# Patient Record
Sex: Female | Born: 1965 | Race: White | Hispanic: No | Marital: Married | Smoking: Never smoker
Health system: Southern US, Community
[De-identification: ages and names within clinical notes are randomized; demographics above are authoritative.]

## PROBLEM LIST (undated history)

## (undated) DIAGNOSIS — N920 Excessive and frequent menstruation with regular cycle: Secondary | ICD-10-CM

## (undated) DIAGNOSIS — R079 Chest pain, unspecified: Secondary | ICD-10-CM

## (undated) DIAGNOSIS — J309 Allergic rhinitis, unspecified: Secondary | ICD-10-CM

## (undated) DIAGNOSIS — K649 Unspecified hemorrhoids: Secondary | ICD-10-CM

## (undated) DIAGNOSIS — K602 Anal fissure, unspecified: Secondary | ICD-10-CM

## (undated) DIAGNOSIS — D649 Anemia, unspecified: Secondary | ICD-10-CM

## (undated) DIAGNOSIS — M2550 Pain in unspecified joint: Secondary | ICD-10-CM

## (undated) DIAGNOSIS — K625 Hemorrhage of anus and rectum: Secondary | ICD-10-CM

## (undated) DIAGNOSIS — E079 Disorder of thyroid, unspecified: Secondary | ICD-10-CM

## (undated) DIAGNOSIS — R600 Localized edema: Secondary | ICD-10-CM

## (undated) DIAGNOSIS — I8390 Asymptomatic varicose veins of unspecified lower extremity: Secondary | ICD-10-CM

## (undated) DIAGNOSIS — J45909 Unspecified asthma, uncomplicated: Secondary | ICD-10-CM

## (undated) HISTORY — DX: Unspecified hemorrhoids: K64.9

## (undated) HISTORY — DX: Allergic rhinitis, unspecified: J30.9

## (undated) HISTORY — DX: Unspecified asthma, uncomplicated: J45.909

## (undated) HISTORY — DX: Localized edema: R60.0

## (undated) HISTORY — PX: TONSILLECTOMY: SHX5217

## (undated) HISTORY — DX: Asymptomatic varicose veins of unspecified lower extremity: I83.90

## (undated) HISTORY — DX: Anemia, unspecified: D64.9

## (undated) HISTORY — PX: TONSILLECTOMY: SUR1361

## (undated) HISTORY — PX: ABDOMINAL SURGERY: SHX537

## (undated) HISTORY — DX: Chest pain, unspecified: R07.9

## (undated) HISTORY — DX: Excessive and frequent menstruation with regular cycle: N92.0

## (undated) HISTORY — PX: KIDNEY SURGERY: SHX687

## (undated) HISTORY — DX: Pain in unspecified joint: M25.50

## (undated) HISTORY — DX: Hemorrhage of anus and rectum: K62.5

## (undated) HISTORY — DX: Anal fissure, unspecified: K60.2

---

## 1999-06-15 ENCOUNTER — Other Ambulatory Visit: Admission: RE | Admit: 1999-06-15 | Discharge: 1999-06-15 | Payer: Self-pay | Admitting: Obstetrics and Gynecology

## 1999-12-04 ENCOUNTER — Inpatient Hospital Stay (HOSPITAL_COMMUNITY): Admission: AD | Admit: 1999-12-04 | Discharge: 1999-12-04 | Payer: Self-pay | Admitting: Obstetrics and Gynecology

## 2000-01-03 ENCOUNTER — Inpatient Hospital Stay (HOSPITAL_COMMUNITY): Admission: AD | Admit: 2000-01-03 | Discharge: 2000-01-06 | Payer: Self-pay | Admitting: Obstetrics & Gynecology

## 2000-02-09 ENCOUNTER — Other Ambulatory Visit: Admission: RE | Admit: 2000-02-09 | Discharge: 2000-02-09 | Payer: Self-pay | Admitting: Obstetrics and Gynecology

## 2001-02-26 ENCOUNTER — Other Ambulatory Visit: Admission: RE | Admit: 2001-02-26 | Discharge: 2001-02-26 | Payer: Self-pay | Admitting: Obstetrics and Gynecology

## 2002-02-28 ENCOUNTER — Other Ambulatory Visit: Admission: RE | Admit: 2002-02-28 | Discharge: 2002-02-28 | Payer: Self-pay | Admitting: Obstetrics and Gynecology

## 2003-04-22 ENCOUNTER — Other Ambulatory Visit: Admission: RE | Admit: 2003-04-22 | Discharge: 2003-04-22 | Payer: Self-pay | Admitting: Obstetrics and Gynecology

## 2004-07-13 ENCOUNTER — Other Ambulatory Visit: Admission: RE | Admit: 2004-07-13 | Discharge: 2004-07-13 | Payer: Self-pay | Admitting: Obstetrics and Gynecology

## 2005-05-08 ENCOUNTER — Ambulatory Visit (HOSPITAL_COMMUNITY): Admission: RE | Admit: 2005-05-08 | Discharge: 2005-05-08 | Payer: Self-pay | Admitting: Allergy

## 2005-07-19 ENCOUNTER — Other Ambulatory Visit: Admission: RE | Admit: 2005-07-19 | Discharge: 2005-07-19 | Payer: Self-pay | Admitting: Obstetrics and Gynecology

## 2009-07-09 ENCOUNTER — Encounter: Admission: RE | Admit: 2009-07-09 | Discharge: 2009-07-09 | Payer: Self-pay | Admitting: Allergy

## 2011-03-03 NOTE — Discharge Summary (Signed)
[facility]  Patient:    Donna Armstrong, Donna Armstrong                       MRN: [id number] Adm. Date:  [id number] Disc. Date: [id number] Attending:  Earlie Server Dictator:   Gayla Medicus, R.N.                           Discharge Summary  ADMISSION DIAGNOSES:          1. Intrauterine pregnancy at term.                               2. In early labor.                               3. Surgically scarred uterus.                               4. For repeat cesarean section.  DISCHARGE DIAGNOSES:          1. Intrauterine pregnancy at term.                               2. In early labor.                               3. Surgically scarred uterus.                               4. For repeat cesarean section.  PROCEDURE:                    On January 03, 2000, repeat cesarean section.  REASON FOR ADMISSION:         The patient is a 45 year old who is scheduled for  delivery in approximately two days.  She presented in labor and was brought to he operating room for a cesarean section.  HOSPITAL COURSE:              The patient is taken to the operating room and undergoes the above named procedure without complications.  This is productive f a viable female infant with Apgars of 8 at one minute and 9 at five minutes and an arterial cord pH of 7.32.  Postoperatively, the patient does well.  On postoperative day #1, the patients hemoglobin was 10.2, hematocrit 29.9, and white blood cell count 17.4.  She was tolerating a regular diet on this day and had good control of pain.  She also had a good return of bowel function and was ambulating well without difficulty.  The patient was discharged home on postoperative day 3 in good condition without complaint.  CONDITION ON DISCHARGE:       Good.  DIET:                         Regular as tolerated.  ACTIVITY:                     No heavy lifting, no driving, and no vaginal entry.  FOLLOW-UP:  She is to follow up in the office in one to two weeks for incision check and she is to call for a temperature greater than 100 degrees, persistent nausea and vomiting, heavy vaginal bleeding, and/or redness or drainage from the incision site.  DISCHARGE MEDICATIONS:        1. Prenatal vitamins one p.o. q.d.                               2. Ibuprofen 600 mg p.o. q.6h. p.r.n. pain. DD:  01/06/00 TD:  01/06/00 Job: [id number]

## 2011-03-03 NOTE — Op Note (Signed)
[facility]  Patient:    Donna Armstrong, Donna Armstrong                       MRN: [id number] Proc. Date: 01/03/00 Adm. Date:  [id number] Attending:  Earlie Server                           Operative Report  PREOPERATIVE DIAGNOSES:       Intrauterine pregnancy at term, surgically scarred uterus, with first delivery by cesarean section for cephalopelvic disproportion.  POSTOPERATIVE DIAGNOSES:      Intrauterine pregnancy at term, surgically scarred uterus, with first delivery by cesarean section for cephalopelvic disproportion.  OPERATIVE PROCEDURE:          Repeat cesarean section with delivery of viable female infant, Apgars of 8/9, birth weight 7 pounds 3 ounces, cord arterial pH 7.32.  SURGEON:                      Maisie Fus, M.D.  ANESTHESIA:                   Spinal.  INTRAOPERATIVE COMPLICATIONS:  None.  INDICATIONS:                  The patient is a 45 year old who was scheduled for delivery in approximately two days.  She presented in labor and was brought to he operating room for cesarean.  DESCRIPTION OF PROCEDURE:     After arrival in the operating room, she was placed under adequate spinal anesthesia and placed in a dorsal recumbent position. The lower abdomen was prepped with Hibiclens because of a Betadine allergy.  Foley catheter was placed under sterile technique.  Sterile drapes were applied.  A lower abdominal incision was made through an old scar and carried sharply down to fascia, which was entered sharply, and extended to the extent of the skin incision. Rectus sheath was developed superiorly and inferiorly with blunt and sharp dissection,  rectus muscles divided in the midline and peritoneum entered.  This was extended sharply and bluntly to the extent of skin incision.  Bladder blade was placed. A transverse incision was made in the visceroperitoneum overlying the lower uterine segment, then the bladder  dissected off the lower segment.  A transverse incision was made in the lower uterine segment and extended bluntly in a transverse direction.  Amniotomy revealed clear fluid.  Using a vacuum-assist device, the infant was then delivered without difficulty.  Apgars and cord pH are noted above. Placenta and other parts of conception were removed from the uterus.  Patient was given a gram of Ancef with clamping of the cord.  Uterine incision was closed in a single layer with running-locking 0 Monocryl.  A figure-of-eight was required at the left margin for complete hemostasis.  Irrigation was carried out.  With adequate hemostasis and appropriate pack, needle and instrument counts, the abdominal incision was closed in layers.  Please note the tubes and ovaries were inspected prior to closure and found to be normal.  The uterus itself was palpably normal.  The abdominal incision was closed with 0 Monocryl for peritoneum and to reapproximate rectus muscles, the fascia was closed with running 0 PDS and skin was closed with wide skin staples and quarter-inch Steri-Strips.  Estimated intraoperative blood loss was 600 cc.  The patient tolerated the procedure well and was taken to recovery in good  condition. DD:  01/04/00 TD:  01/04/00 Job: [id number]

## 2014-07-21 ENCOUNTER — Encounter: Payer: Self-pay | Admitting: Vascular Surgery

## 2014-07-23 ENCOUNTER — Encounter: Payer: Self-pay | Admitting: Vascular Surgery

## 2014-07-23 ENCOUNTER — Other Ambulatory Visit: Payer: Self-pay | Admitting: *Deleted

## 2014-07-23 DIAGNOSIS — I83893 Varicose veins of bilateral lower extremities with other complications: Secondary | ICD-10-CM

## 2014-07-30 ENCOUNTER — Encounter: Payer: Self-pay | Admitting: Vascular Surgery

## 2014-07-31 ENCOUNTER — Ambulatory Visit (INDEPENDENT_AMBULATORY_CARE_PROVIDER_SITE_OTHER): Payer: 59 | Admitting: Vascular Surgery

## 2014-07-31 ENCOUNTER — Ambulatory Visit (HOSPITAL_COMMUNITY)
Admission: RE | Admit: 2014-07-31 | Discharge: 2014-07-31 | Disposition: A | Discharge status: Home / Self Care | Payer: 59 | Source: Ambulatory Visit | Attending: Vascular Surgery | Admitting: Vascular Surgery

## 2014-07-31 ENCOUNTER — Encounter: Payer: Self-pay | Admitting: Vascular Surgery

## 2014-07-31 VITALS — BP 108/77 | HR 79 | Wt 165.9 lb

## 2014-07-31 DIAGNOSIS — I872 Venous insufficiency (chronic) (peripheral): Secondary | ICD-10-CM | POA: Insufficient documentation

## 2014-07-31 DIAGNOSIS — I83893 Varicose veins of bilateral lower extremities with other complications: Secondary | ICD-10-CM | POA: Insufficient documentation

## 2014-07-31 DIAGNOSIS — M7989 Other specified soft tissue disorders: Secondary | ICD-10-CM | POA: Insufficient documentation

## 2014-07-31 NOTE — Progress Notes (Signed)
Reason for referral: Swollen bilateral leg  History of Present Illness  Donna Armstrong is a 48 y.o. ([birth year]) female who presents with chief complaint: swollen leg.  Patient notes, onset of swelling 6 months ago, associated with no trigger.  The patient's symptoms include: swelling as day progresses with sense of tightness in calf and discomfort with such.  This was especially worsened on a trip to [place] recently.  The patient has had no history of DVT, no history of pregnancy, no history of varicose vein, no history of venous stasis ulcers, no history of  Lymphedema and no history of skin changes in lower legs.  There is known family history of venous disorders.  The patient has used OTC compression stockings in the past.  Past Medical History  Diagnosis Date  . Varicose veins   . Asthma   . Hemorrhoids   . Joint pain   . Menorrhagia   . Anemia     Past Surgical History  Procedure Laterality Date  . Tonsillectomy    . Cesarean section      X2  . Kidney surgery      kidney/bladder - exploratory     History   Social History  . Marital Status: Single    Spouse Name: N/A    Number of Children: N/A  . Years of Education: N/A   Occupational History  . Not on file.   Social History Main Topics  . Smoking status: Never Smoker   . Smokeless tobacco: Never Used  . Alcohol Use: No  . Drug Use: No  . Sexual Activity: Not on file   Other Topics Concern  . Not on file   Social History Narrative  . No narrative on file    Family History  Problem Relation Age of Onset  . Heart disease Mother   . Hyperlipidemia Mother   . Hypertension Mother   . Varicose Veins Mother   . Heart disease Father   . Hyperlipidemia Father   . Hypertension Father   . Heart attack Father      Current Outpatient Prescriptions on File Prior to Visit  Medication Sig Dispense Refill  . aspirin 81 MG tablet Take 81 mg by mouth daily.      . Calcium Carb-Cholecalciferol (CALCIUM 1000 + D  PO) Take 1,000 mg by mouth daily.      . cetirizine (ZYRTEC) 10 MG tablet Take 10 mg by mouth daily.      . fluticasone (FLONASE) 50 MCG/ACT nasal spray Place into both nostrils daily.      Marland Kitchen levalbuterol (XOPENEX HFA) 45 MCG/ACT inhaler Inhale 2 puffs into the lungs every 4 (four) hours as needed for wheezing.      . levalbuterol (XOPENEX) 1.25 MG/0.5ML nebulizer solution Take 1.25 mg by nebulization every 4 (four) hours as needed for wheezing or shortness of breath.      . Multiple Vitamin (MULTIVITAMIN) tablet Take 1 tablet by mouth daily.      Marland Kitchen PROGESTERONE, VAGINAL, 8 % GEL Place vaginally. Uses on chest and upper arms days 17-28 only       No current facility-administered medications on file prior to visit.    Allergies  Allergen Reactions  . Eggs Or Egg-Derived Products   . Ketek [Telithromycin] Other (See Comments)    Dizziness   . Erythromycin Rash    REVIEW OF SYSTEMS:  (Positives checked otherwise negative)  CARDIOVASCULAR:  []  chest pain, []  chest pressure, []  palpitations, []  shortness  of breath when laying flat, []  shortness of breath with exertion,  []  pain in feet when walking, []  pain in feet when laying flat, []  history of blood clot in veins (DVT), []  history of phlebitis, [x]  swelling in legs, []  varicose veins  PULMONARY:  []  productive cough, []  asthma, []  wheezing  NEUROLOGIC:  []  weakness in arms or legs, []  numbness in arms or legs, []  difficulty speaking or slurred speech, []  temporary loss of vision in one eye, []  dizziness  HEMATOLOGIC:  []  bleeding problems, []  problems with blood clotting too easily  MUSCULOSKEL:  []  joint pain, []  joint swelling  GASTROINTEST:  []  vomiting blood, []  blood in stool     GENITOURINARY:  []  burning with urination, []  blood in urine  PSYCHIATRIC:  []  history of major depression  INTEGUMENTARY:  []  rashes, []  ulcers  CONSTITUTIONAL:  []  fever, []  chills   Physical Examination Filed Vitals:   07/31/14 1119  BP:  108/77  Pulse: 79  Weight: 165 lb 14.4 oz (75.252 kg)  SpO2: 99%   There is no height on file to calculate BMI.  General: A&O x 3, WDWN  Head: [place]/AT  Ear/Nose/Throat: Hearing grossly intact, nares w/o erythema or drainage, oropharynx w/o Erythema/Exudate  Eyes: PERRLA, EOMI  Neck: Supple, no nuchal rigidity, no palpable LAD  Pulmonary: Sym exp, good air movt, CTAB, no rales, rhonchi, & wheezing  Cardiac: RRR, Nl S1, S2, no Murmurs, rubs or gallops  Vascular: Vessel Right Left  Radial Palpable Palpable  Brachial Palpable Palpable  Carotid Palpable, without bruit Palpable, without bruit  Aorta Not palpable N/A  Femoral Palpable Palpable  Popliteal Not palpable Not palpable  PT Palpable Palpable  DP Palpable Palpable   Gastrointestinal: soft, NTND, -G/R, - HSM, - masses, - CVAT B,   Musculoskeletal: M/S 5/5 throughout , Extremities without ischemic changes , 1+ edema B, no LDS  Neurologic: CN 2-12 intact , Pain and light touch intact in extremities , Motor exam as listed above  Psychiatric: Judgment intact, Mood & affect appropriate for pt's clinical situation  Dermatologic: See M/S exam for extremity exam, no rashes otherwise noted  Lymph : No Cervical, Axillary, or Inguinal lymphadenopathy   Non-Invasive Vascular Imaging  BLE Venous Insufficiency Duplex (Date: 07/31/2014):   RLE: no DVT and SVT, + GSV reflux, + deep venous reflux: CFV  LLE: no DVT and SVT, + GSV reflux, + deep venous reflux: PV  Outside Studies/Documentation 5 pages of outside documents were reviewed including: outpatient PCP chart.  Medical Decision Making  Donna Armstrong is a 48 y.o. female who presents with: BLE chronic venous insufficiency (C2).   Based on the patient's history and examination, I recommend: compressive therapy.  I suspect compression might be enough for this patient, given she does not have continuous segment of incompetency.  I discussed with the patient the use of  her 20-30 mm thigh high compression stockings and need for 3 month trial of such.  The patient will follow up in 3 months with my partners in the [facility] for evaluation for: EVLA B GSV.  Thank you for allowing [place] to participate in this patient's care.  Adele Barthel, MD [facility] Office: [phone] Pager: [phone]  07/31/2014, 11:47 AM

## 2014-11-02 ENCOUNTER — Encounter: Payer: Self-pay | Admitting: *Deleted

## 2014-11-03 ENCOUNTER — Ambulatory Visit: Payer: 59 | Admitting: Vascular Surgery

## 2014-11-10 ENCOUNTER — Ambulatory Visit: Payer: 59 | Admitting: Vascular Surgery

## 2015-07-01 ENCOUNTER — Other Ambulatory Visit: Payer: Self-pay | Admitting: Nurse Practitioner

## 2015-07-01 DIAGNOSIS — N6452 Nipple discharge: Secondary | ICD-10-CM

## 2015-07-09 ENCOUNTER — Ambulatory Visit
Admission: RE | Admit: 2015-07-09 | Discharge: 2015-07-09 | Disposition: A | Discharge status: Home / Self Care | Payer: Commercial Managed Care - HMO | Source: Ambulatory Visit | Attending: Nurse Practitioner | Admitting: Nurse Practitioner

## 2015-07-09 DIAGNOSIS — N6452 Nipple discharge: Secondary | ICD-10-CM

## 2017-02-14 ENCOUNTER — Telehealth: Payer: Self-pay

## 2017-02-14 NOTE — Telephone Encounter (Signed)
SENT NOTES TO SCHEDULING 

## 2017-02-15 ENCOUNTER — Encounter: Payer: Self-pay | Admitting: Cardiovascular Disease

## 2017-02-15 ENCOUNTER — Telehealth: Payer: Self-pay | Admitting: Cardiovascular Disease

## 2017-02-15 NOTE — Telephone Encounter (Signed)
Received records from [facility] for appointment on 02/16/17 with Dr Claiborne Billings.  Records put with Dr Evette Georges schedule for 02/16/17. lp

## 2017-02-16 ENCOUNTER — Encounter: Payer: Self-pay | Admitting: Cardiovascular Disease

## 2017-02-16 ENCOUNTER — Ambulatory Visit (INDEPENDENT_AMBULATORY_CARE_PROVIDER_SITE_OTHER): Payer: 59 | Admitting: Cardiovascular Disease

## 2017-02-16 VITALS — BP 114/76 | HR 72 | Ht 64.0 in | Wt 177.0 lb

## 2017-02-16 DIAGNOSIS — J45990 Exercise induced bronchospasm: Secondary | ICD-10-CM

## 2017-02-16 DIAGNOSIS — R079 Chest pain, unspecified: Secondary | ICD-10-CM

## 2017-02-16 DIAGNOSIS — R946 Abnormal results of thyroid function studies: Secondary | ICD-10-CM

## 2017-02-16 DIAGNOSIS — Z8249 Family history of ischemic heart disease and other diseases of the circulatory system: Secondary | ICD-10-CM

## 2017-02-16 DIAGNOSIS — J302 Other seasonal allergic rhinitis: Secondary | ICD-10-CM

## 2017-02-16 DIAGNOSIS — E039 Hypothyroidism, unspecified: Secondary | ICD-10-CM

## 2017-02-16 LAB — CBC
HCT: 41.4 % (ref 35.0–45.0)
Hemoglobin: 13 g/dL (ref 11.7–15.5)
MCH: 27.3 pg (ref 27.0–33.0)
MCHC: 31.4 g/dL — ABNORMAL LOW (ref 32.0–36.0)
MCV: 87 fL (ref 80.0–100.0)
MPV: 9.6 fL (ref 7.5–12.5)
PLATELETS: 402 10*3/uL — AB (ref 140–400)
RBC: 4.76 MIL/uL (ref 3.80–5.10)
RDW: 14.8 % (ref 11.0–15.0)
WBC: 8.2 10*3/uL (ref 3.8–10.8)

## 2017-02-16 MED ORDER — METOPROLOL TARTRATE 25 MG PO TABS: 12.5000 mg | ORAL_TABLET | Freq: Two times a day (BID) | ORAL | 6 refills | Status: DC

## 2017-02-16 NOTE — Progress Notes (Addendum)
Cardiology Office Note    Date:  02/16/2017   ID:  Donna Armstrong, Donna Armstrong 1966/04/20, MRN [id number]  PCP:  Lujean Amel, MD  Cardiologist:  Shelva Majestic, MD    Chief Complaint  Patient presents with  . Follow-up    New patient.  . Shortness of Breath  . Chest Pain    Tightness  . Edema    Cardiology consultation for exertional chest pain, referred by Dr. Dorthy Cooler  History of Present Illness:  Donna Armstrong is a 51 y.o. female who is referred through the courtesy of Dr. Janalyn Shy for evaluation of exertional chest tightness in this patient with a strong family history for CAD.  Donna Armstrong denies any history of known CAD.  Last year, she was hiking in the mountains and had experienced chest pressure all walking up steep terrain.  She admits that she has not been exercising regularly.  She has gained weight recently and as a result has been trying over the last several weeks to increase her walking and hasn't walking 2-3 times per week.  She has recently noticed a sensation of exertionally precipitated chest tightness which is different from her previous exertional asthma type chest discomfort.  These episodes are short-lived, but always been exertionally precipitated and resolve with rest.  She is unaware of any left arm radiation.  There is mild shortness of breath.  She denies associated palpitations, nausea or diaphoresis.  She was recently evaluated by her primary physician, Dr. Dorthy Cooler and with her family history for heart disease with her father having undergone bypass surgery and MI and had undergone several additional interventions in a grandfather who died around 38 with an MI.  With her change in symptomatology.  She is now referred for cardiology consultation.  Her history is also notable for mild exertional asthma.  She states a while back.  She was on a very low dose of thyroid medicine when she was told that she had possible mild hypothyroidism.  She has not had her  cholesterol checked in several years.  There is no history of hypertension.  She denies palpitations.  She admits to some daytime sleepiness and an Epworth Sleepiness Scale score was calculated in the office today and endorsed at 11. She sleeps well at night and is unaware of any snoring or frequent awakenings.   Past Medical History:  Diagnosis Date  . Allergic rhinitis    DR. [place] [facility] [place]  . Anal fissure   . Anemia   . Asthma   . Bilateral leg edema   . Hemorrhoids   . Joint pain   . Menorrhagia   . Menorrhagia   . Rectal bleeding   . Varicose veins     Past Surgical History:  Procedure Laterality Date  . CESAREAN SECTION     X2  . KIDNEY SURGERY     kidney/bladder - exploratory   . TONSILLECTOMY      Current Medications: Outpatient Medications Prior to Visit  Medication Sig Dispense Refill  . aspirin 81 MG tablet Take 81 mg by mouth daily.    . Calcium Carb-Cholecalciferol (CALCIUM 1000 + D PO) Take 1,000 mg by mouth daily.    . cetirizine (ZYRTEC) 10 MG tablet Take 10 mg by mouth daily.    . fluticasone (FLONASE) 50 MCG/ACT nasal spray Place into both nostrils daily.    Marland Kitchen levalbuterol (XOPENEX HFA) 45 MCG/ACT inhaler Inhale 2 puffs into the lungs every 4 (four) hours as needed for wheezing.    Marland Kitchen  levalbuterol (XOPENEX) 1.25 MG/0.5ML nebulizer solution Take 1.25 mg by nebulization every 4 (four) hours as needed for wheezing or shortness of breath.    . Multiple Vitamin (MULTIVITAMIN) tablet Take 1 tablet by mouth daily.    Marland Kitchen PROGESTERONE, VAGINAL, 8 % GEL Place vaginally. Uses on chest and upper arms days 17-28 only    . valACYclovir (VALTREX) 1000 MG tablet Take 1,000 mg by mouth 3 (three) times daily.     No facility-administered medications prior to visit.      Allergies:   Ketek [telithromycin] and Erythromycin   Social History   Social History  . Marital status: Single    Spouse name: N/A  . Number of children: N/A  . Years of education: N/A   Occupational  History  . COMPUTER PROGRAMMER    Social History Main Topics  . Smoking status: Never Smoker  . Smokeless tobacco: Never Used  . Alcohol use No  . Drug use: No  . Sexual activity: Not Asked   Other Topics Concern  . None   Social History Narrative  . None    Socially she is married for 29 years.  She has 2 children.  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  She has a Scientist, water quality and had attended [employer] as well as [employer].  Family History:  The patient's family history includes Heart attack in her father; Heart attack (age of onset: 21) in her paternal grandfather; Heart disease in her father and mother; Hyperlipidemia in her father and mother; Hypertension in her father and mother; Varicose Veins in her mother.  Her father had undergone prior CABG revascularization surgery  ROS General: Negative; No fevers, chills, or night sweats;  HEENT: Negative; No changes in vision or hearing, sinus congestion, difficulty swallowing Pulmonary: Positive for mild exertional asthma; she takes allergy shots with Dr. Donneta Romberg Cardiovascular: Negative; No chest pain, presyncope, syncope, palpitations GI: Negative; No nausea, vomiting, diarrhea, or abdominal pain GU: Negative; No dysuria, hematuria, or difficulty voiding Musculoskeletal: Negative; no myalgias, joint pain, or weakness Hematologic/Oncology: Negative; no easy bruising, bleeding Endocrine:  She had taken a very low-dose of levothyroxine a short time for possible borderline hypothyroidism Neuro: Negative; no changes in balance, headaches Skin: Negative; No rashes or skin lesions Psychiatric: Negative; No behavioral problems, depression Sleep:  mild daytime sleepiness, no snoring, bruxism, restless legs, hypnogognic hallucinations, no cataplexy Other comprehensive 14 point system review is negative.   PHYSICAL EXAM:   VS:  BP 114/76   Pulse 72   Ht 5' 4"  (1.626 m)   Wt 177 lb (80.3 kg)   BMI 30.38 kg/m     Repeat blood pressure  by me 112/78  Wt Readings from Last 3 Encounters:  02/16/17 177 lb (80.3 kg)  07/31/14 165 lb 14.4 oz (75.3 kg)    General: Alert, oriented, no distress.  Skin: normal turgor, no rashes, warm and dry HEENT: Normocephalic, atraumatic. Pupils equal round and reactive to light; sclera anicteric; extraocular muscles intact; Fundi Normal Nose without nasal septal hypertrophy Mouth/Parynx benign; Mallinpatti scale 3 Neck: No JVD, no carotid bruits; normal carotid upstroke Lungs: clear to ausculatation and percussion; no wheezing or rales Chest wall: without tenderness to palpitation Heart: PMI not displaced, RRR, s1 s2 normal, trivial 1/6 systolic murmur, no diastolic murmur, no rubs, gallops, thrills, or heaves Abdomen: soft, nontender; no hepatosplenomehaly, BS+; abdominal aorta nontender and not dilated by palpation. Back: no CVA tenderness Pulses 2+ Musculoskeletal: full range of motion, normal strength, no  joint deformities Extremities: Trace bilateral ankle edema ;no clubbing cyanosis, Homan's sign negative  Neurologic: grossly nonfocal; Cranial nerves grossly wnl Psychologic: Normal mood and affect   Studies/Labs Reviewed:   EKG:  EKG is ordered today.  ECG (independently read by me): Normal sinus rhythm at 72 bpm.  Nondiagnostic T-wave in lead 3.  Normal intervals.  Recent Labs: No flowsheet data found.   No flowsheet data found.  No flowsheet data found. No results found for: MCV No results found for: TSH No results found for: HGBA1C   BNP No results found for: BNP  ProBNP No results found for: PROBNP   Lipid Panel  No results found for: CHOL, TRIG, HDL, CHOLHDL, VLDL, LDLCALC, LDLDIRECT   RADIOLOGY: No results found.   Additional studies/ records that were reviewed today include:  I have reviewed her records from [place]:    1. Exertional chest pain   2. Family history of early CAD   3. Borderline hypothyroidism   4. Exercise-induced  asthma   5. Seasonal allergic rhinitis, unspecified trigger      PLAN:  Donna Armstrong is a 51 year old female who is a strong family history for CAD with the mother having history of hypertension and is status post pacemaker insertion, her father who had myocardial infarctions 2, and prior bypass surgery as well as a grandfather who suffered a heart attack prematurely.  The patient admits to development of exertionally precipitated chest tightness.  She had experienced an initial episode one year ago, but had not been very active until recently when she has increased her walking and has noticed recurrent symptomatology.  She experiences the symptoms with walking up hill in her driveway and also while climbing steps.  Typically these episodes last only several minutes, but abate when she stops walking.  There is no history of hypertension, tobacco use, or awareness of hyperlipidemia.  She is fasting today.  A complete set of fasting laboratory will be obtained.  I spent time with her discussing her symptomatology.  Her symptoms are clearly different from her sensation of exertional asthma that she has experienced in the past.  We discussed different strategies for an ischemic workup including exercise nuclear imaging versus definitive cardiac catheterization.  Presently, I'm adding metoprolol 12.5 mg twice a day to her medical regimen.  After much discussion, she will undergo an exercise nuclear stress test next week in our office.  I will see her the following week for follow-up evaluation and further recommendations will be made at that time.  She will hold her metoprolol for 48 hours prior to the exercise nuclear study.  I discussed the sensitivity and specificity of exercise nuclear imaging as well as the risks, benefits of cardiac catheterization in detail.  I will see her in 2 weeks for reevaluation.   Medication Adjustments/Labs and Tests Ordered: Current medicines are reviewed at length with the  patient today.  Concerns regarding medicines are outlined above.  Medication changes, Labs and Tests ordered today are listed in the Patient Instructions below. Patient Instructions  Your physician has requested that you have en exercise stress myoview next week.  Your physician recommends that you return for lab work TODAY.   Your physician recommends that you schedule a follow-up appointment on May 15th      Signed, Shelva Majestic, MD  02/16/2017 9:42 AM    [facility] [address] [place], [place],   [address] Phone: [phone]

## 2017-02-16 NOTE — Patient Instructions (Signed)
Your physician has requested that you have en exercise stress myoview next week.  Your physician recommends that you return for lab work TODAY.   Your physician recommends that you schedule a follow-up appointment on May 15th

## 2017-02-17 LAB — COMPREHENSIVE METABOLIC PANEL
ALK PHOS: 166 U/L — AB (ref 33–130)
ALT: 22 U/L (ref 6–29)
AST: 19 U/L (ref 10–35)
Albumin: 3.8 g/dL (ref 3.6–5.1)
BUN: 13 mg/dL (ref 7–25)
CO2: 27 mmol/L (ref 20–31)
Calcium: 9.2 mg/dL (ref 8.6–10.4)
Chloride: 104 mmol/L (ref 98–110)
Creat: 0.76 mg/dL (ref 0.50–1.05)
GLUCOSE: 86 mg/dL (ref 65–99)
POTASSIUM: 4.8 mmol/L (ref 3.5–5.3)
Sodium: 139 mmol/L (ref 135–146)
Total Bilirubin: 0.5 mg/dL (ref 0.2–1.2)
Total Protein: 6.9 g/dL (ref 6.1–8.1)

## 2017-02-17 LAB — LIPID PANEL
CHOL/HDL RATIO: 2.2 ratio (ref <5.0)
Cholesterol: 191 mg/dL (ref <200)
HDL: 86 mg/dL (ref >50)
LDL CALC: 93 mg/dL (ref <100)
Triglycerides: 62 mg/dL (ref <150)
VLDL: 12 mg/dL (ref <30)

## 2017-02-17 LAB — TSH: TSH: 3.51 m[IU]/L

## 2017-02-21 ENCOUNTER — Telehealth (HOSPITAL_COMMUNITY): Payer: Self-pay

## 2017-02-21 NOTE — Telephone Encounter (Signed)
Encounter complete. 

## 2017-02-23 ENCOUNTER — Ambulatory Visit (HOSPITAL_COMMUNITY)
Admission: RE | Admit: 2017-02-23 | Discharge: 2017-02-23 | Disposition: A | Discharge status: Home / Self Care | Payer: 59 | Source: Ambulatory Visit | Attending: Cardiovascular Disease | Admitting: Cardiovascular Disease

## 2017-02-23 DIAGNOSIS — J45909 Unspecified asthma, uncomplicated: Secondary | ICD-10-CM | POA: Insufficient documentation

## 2017-02-23 DIAGNOSIS — I2589 Other forms of chronic ischemic heart disease: Secondary | ICD-10-CM | POA: Insufficient documentation

## 2017-02-23 DIAGNOSIS — R079 Chest pain, unspecified: Secondary | ICD-10-CM | POA: Diagnosis not present

## 2017-02-23 DIAGNOSIS — Z8249 Family history of ischemic heart disease and other diseases of the circulatory system: Secondary | ICD-10-CM

## 2017-02-23 IMAGING — NM NM MISC PROCEDURE
9 series · 54 of 54 positions shown · non-contrast
Comparison: none

[Series 1: wbr_r-proj_st wbr rest · 6.40mm/px · 6 of 64 frames shown]
[frame 6/64]
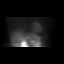
[frame 16/64]
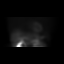
[frame 27/64]
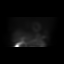
[frame 38/64]
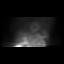
[frame 48/64]
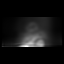
[frame 59/64]
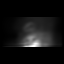

[Series 1: wbr rest · 6.40mm/px · 6 of 64 frames shown]
[frame 6/64]
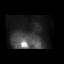
[frame 16/64]
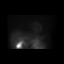
[frame 27/64]
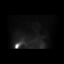
[frame 38/64]
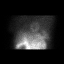
[frame 48/64]
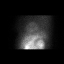
[frame 59/64]
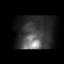

[Series 1: rest sax · 6.4mm · 6.40mm/px · 6 of 23 frames shown]
[frame 2/23]
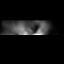
[frame 6/23]
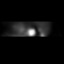
[frame 10/23]
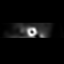
[frame 14/23]
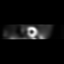
[frame 18/23]
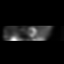
[frame 22/23]
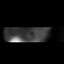

[Series 2: stress sax · 6.4mm · 6.40mm/px · 6 of 23 frames shown]
[frame 2/23]
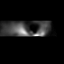
[frame 6/23]
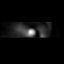
[frame 10/23]
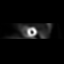
[frame 14/23]
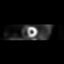
[frame 18/23]
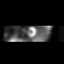
[frame 22/23]
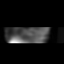

[Series 2: wbr stress-gsp · 6.40mm/px · 6 of 512 frames shown]
[frame 43/512]
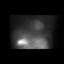
[frame 128/512]
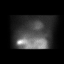
[frame 214/512]
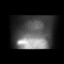
[frame 299/512]
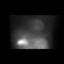
[frame 384/512]
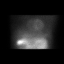
[frame 470/512]
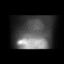

[Series 2: stress sax gs · 6.4mm · 6.40mm/px · 6 of 184 frames shown]
[frame 16/184]
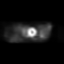
[frame 46/184]
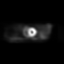
[frame 77/184]
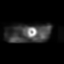
[frame 108/184]
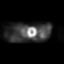
[frame 138/184]
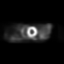
[frame 169/184]
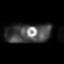

[Series 2: wbr_s-proj_st wbr stress-gsp · 6.40mm/px · 6 of 512 frames shown]
[frame 43/512]
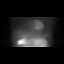
[frame 128/512]
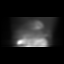
[frame 214/512]
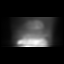
[frame 299/512]
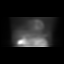
[frame 384/512]
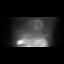
[frame 470/512]
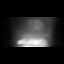

[Series 3: wbr stress-sum-em · 6.40mm/px · 6 of 64 frames shown]
[frame 6/64]
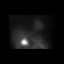
[frame 16/64]
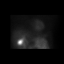
[frame 27/64]
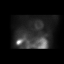
[frame 38/64]
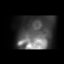
[frame 48/64]
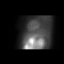
[frame 59/64]
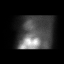

[Series 3: wbr_s-proj_st wbr stress-sum-em · 6.40mm/px · 6 of 64 frames shown]
[frame 6/64]
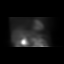
[frame 16/64]
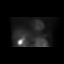
[frame 27/64]
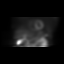
[frame 38/64]
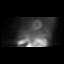
[frame 48/64]
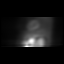
[frame 59/64]
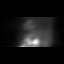

[54 of 54 positions shown; findings below may reference images not displayed]

Canned report from images found in remote index.

Refer to host system for actual result text.

## 2017-02-23 MED ORDER — TECHNETIUM TC 99M TETROFOSMIN IV KIT
30.2000 | PACK | Freq: Once | INTRAVENOUS | Status: AC | PRN
  Administered 2017-02-23: 30.2 via INTRAVENOUS
  Filled 2017-02-23: qty 31

## 2017-02-23 MED ORDER — TECHNETIUM TC 99M TETROFOSMIN IV KIT
9.7000 | PACK | Freq: Once | INTRAVENOUS | Status: AC | PRN
  Administered 2017-02-23: 9.7 via INTRAVENOUS
  Filled 2017-02-23: qty 10

## 2017-02-26 LAB — MYOCARDIAL PERFUSION IMAGING
CHL CUP MPHR: 170 {beats}/min
CHL CUP NUCLEAR SDS: 5
CHL CUP NUCLEAR SRS: 0
CHL CUP NUCLEAR SSS: 5
CHL RATE OF PERCEIVED EXERTION: 17
CSEPED: 6 min
CSEPEW: 8.1 METS
Exercise duration (sec): 45 s
LV dias vol: 66 mL (ref 46–106)
LV sys vol: 18 mL
NUC STRESS TID: 0.96
Peak HR: 171 {beats}/min
Percent HR: 100 %
Rest HR: 72 {beats}/min

## 2017-02-27 ENCOUNTER — Encounter: Payer: Self-pay | Admitting: Cardiovascular Disease

## 2017-02-27 ENCOUNTER — Ambulatory Visit (INDEPENDENT_AMBULATORY_CARE_PROVIDER_SITE_OTHER): Payer: 59 | Admitting: Cardiovascular Disease

## 2017-02-27 ENCOUNTER — Telehealth: Payer: Self-pay | Admitting: Cardiovascular Disease

## 2017-02-27 VITALS — BP 112/73 | HR 73 | Ht 64.0 in | Wt 175.2 lb

## 2017-02-27 DIAGNOSIS — Z79899 Other long term (current) drug therapy: Secondary | ICD-10-CM

## 2017-02-27 DIAGNOSIS — R7982 Elevated C-reactive protein (CRP): Secondary | ICD-10-CM | POA: Diagnosis not present

## 2017-02-27 DIAGNOSIS — R079 Chest pain, unspecified: Secondary | ICD-10-CM

## 2017-02-27 DIAGNOSIS — Z8249 Family history of ischemic heart disease and other diseases of the circulatory system: Secondary | ICD-10-CM | POA: Diagnosis not present

## 2017-02-27 DIAGNOSIS — J302 Other seasonal allergic rhinitis: Secondary | ICD-10-CM

## 2017-02-27 MED ORDER — METOPROLOL TARTRATE 25 MG PO TABS: 25.0000 mg | ORAL_TABLET | Freq: Two times a day (BID) | ORAL | 6 refills | Status: DC

## 2017-02-27 NOTE — Telephone Encounter (Signed)
Called the patient to schedule her 3 month followup with Dr. Claiborne Billings and left a VM to call me back.

## 2017-02-27 NOTE — Progress Notes (Signed)
Cardiology Office Note    Date:  03/01/2017   ID:  Donna, Armstrong [birth year], MRN [id number]  PCP:  Lujean Amel, MD  Cardiologist:  Shelva Majestic, MD    Chief Complaint  Patient presents with  . Follow-up    History of Present Illness:  Donna Armstrong is a 51 y.o. female who iswas referred through the courtesy of Dr. Janalyn Shy for evaluation of exertional chest tightness in this patient with a strong family history for CAD. I saw her for initial evaluationon 02/16/2017. She presents for two-week follow-up evaluation  Donna Armstrong denies any history of known CAD.  Last year, she was hiking in the mountains and had experienced chest pressure all walking up steep terrain.  She admits that she has not been exercising regularly.  She has gained weight recently and as a result has been trying over the last several weeks to increase her walking and hasn't walking 2-3 times per week.  She has recently noticed a sensation of exertionally precipitated chest tightness which is different from her previous exertional asthma type chest discomfort.  These episodes are short-lived, but always been exertionally precipitated and resolve with rest.  She is unaware of any left arm radiation.  There is mild shortness of breath.  She denies associated palpitations, nausea or diaphoresis.  She was recently evaluated by her primary physician, Dr. Dorthy Cooler and with her family history for heart disease with her father having undergone bypass surgery and MI and had undergone several additional interventions in a grandfather who died around 89 with an MI.  With her change in symptomatology.  She is now referred for cardiology consultation.  Her history is also notable for mild exertional asthma.  She states a while back.  She was on a very low dose of thyroid medicine when she was told that she had possible mild hypothyroidism.  She has not had her cholesterol checked in several years.  There is no history of  hypertension.  She denies palpitations.  She admits to some daytime sleepiness and an Epworth Sleepiness Scale score was calculated in the office today and endorsed at 11. She sleeps well at night and is unaware of any snoring or frequent awakenings.  Since her evaluation, she underwent laboratory which revealed a mildly persistent increase in her alkaline phosphatase, which she states has been there for many years.  Other LFTs were normal.  Her lipid studies revealed a total cluster 191, triglycerides 62, HDL 86, and LDL 93.  Her hemoglobin and hematocrit were stable at 13 and 41.4.  TSH was normal at 3.51.  She underwent a nuclear stress test and developed  2 mm of ST depression which began at 6 minutes of exercise and ended at 7 minutes of stress. She admitted to fatigue and shortness of breath and had atypical chest pain.  Scintigraphic images showed fairly normal perfusion with the exception of perhaps a very small region of possible distal inferior apical ischemia.  He was interpreted as a low risk study.  She had normal wall motion and ejection fraction was 73%.  She is not experiencing any further symptoms of chest pain recently.  She presents for follow-up evaluation.   Past Medical History:  Diagnosis Date  . Allergic rhinitis    DR. Wirt  . Anal fissure   . Anemia   . Asthma   . Bilateral leg edema   . Hemorrhoids   . Joint pain   . Menorrhagia   .  Menorrhagia   . Rectal bleeding   . Varicose veins     Past Surgical History:  Procedure Laterality Date  . CESAREAN SECTION     X2  . KIDNEY SURGERY     kidney/bladder - exploratory   . TONSILLECTOMY      Current Medications: Outpatient Medications Prior to Visit  Medication Sig Dispense Refill  . aspirin 81 MG tablet Take 81 mg by mouth daily.    . Calcium Carb-Cholecalciferol (CALCIUM 1000 + D PO) Take 1,000 mg by mouth daily.    . cetirizine (ZYRTEC) 10 MG tablet Take 10 mg by mouth daily.    . fluticasone (FLONASE) 50  MCG/ACT nasal spray Place into both nostrils daily.    Marland Kitchen levalbuterol (XOPENEX HFA) 45 MCG/ACT inhaler Inhale 2 puffs into the lungs every 4 (four) hours as needed for wheezing.    . levalbuterol (XOPENEX) 1.25 MG/0.5ML nebulizer solution Take 1.25 mg by nebulization every 4 (four) hours as needed for wheezing or shortness of breath.    . Multiple Vitamin (MULTIVITAMIN) tablet Take 1 tablet by mouth daily.    . metoprolol tartrate (LOPRESSOR) 25 MG tablet Take 0.5 tablets (12.5 mg total) by mouth 2 (two) times daily. 30 tablet 6   No facility-administered medications prior to visit.      Allergies:   Ketek [telithromycin] and Erythromycin   Social History   Social History  . Marital status: Single    Spouse name: N/A  . Number of children: N/A  . Years of education: N/A   Occupational History  . COMPUTER PROGRAMMER    Social History Main Topics  . Smoking status: Never Smoker  . Smokeless tobacco: Never Used  . Alcohol use No  . Drug use: No  . Sexual activity: Not Asked   Other Topics Concern  . None   Social History Narrative  . None    Socially she is married for 29 years.  She has 2 children.  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  She has a Scientist, water quality and had attended [employer] as well as [employer].  Family History:  The patient's family history includes Heart attack in her father; Heart attack (age of onset: 75) in her paternal grandfather; Heart disease in her father and mother; Hyperlipidemia in her father and mother; Hypertension in her father and mother; Varicose Veins in her mother.  Her father had undergone prior CABG revascularization surgery  ROS General: Negative; No fevers, chills, or night sweats;  HEENT: Negative; No changes in vision or hearing, sinus congestion, difficulty swallowing Pulmonary: Positive for mild exertional asthma; she takes allergy shots with Dr. Donneta Romberg Cardiovascular: Negative; No chest pain, presyncope, syncope, palpitations GI:  Negative; No nausea, vomiting, diarrhea, or abdominal pain GU: Negative; No dysuria, hematuria, or difficulty voiding Musculoskeletal: Negative; no myalgias, joint pain, or weakness Hematologic/Oncology: Negative; no easy bruising, bleeding Endocrine:  She had taken a very low-dose of levothyroxine a short time for possible borderline hypothyroidism Neuro: Negative; no changes in balance, headaches Skin: Negative; No rashes or skin lesions Psychiatric: Negative; No behavioral problems, depression Sleep:  mild daytime sleepiness, no snoring, bruxism, restless legs, hypnogognic hallucinations, no cataplexy Other comprehensive 14 point system review is negative.   PHYSICAL EXAM:   VS:  BP 112/73   Pulse 73   Ht 5' 4"  (1.626 m)   Wt 175 lb 3.2 oz (79.5 kg)   BMI 30.07 kg/m     Repeat blood pressure by me 112/78  Wt  Readings from Last 3 Encounters:  02/27/17 175 lb 3.2 oz (79.5 kg)  02/23/17 177 lb (80.3 kg)  02/16/17 177 lb (80.3 kg)    General: Alert, oriented, no distress.  Skin: normal turgor, no rashes, warm and dry HEENT: Normocephalic, atraumatic. Pupils equal round and reactive to light; sclera anicteric; extraocular muscles intact; Fundi Normal Nose without nasal septal hypertrophy Mouth/Parynx benign; Mallinpatti scale 3 Neck: No JVD, no carotid bruits; normal carotid upstroke Lungs: clear to ausculatation and percussion; no wheezing or rales Chest wall: without tenderness to palpitation Heart: PMI not displaced, RRR, s1 s2 normal, trivial 1/6 systolic murmur, no diastolic murmur, no rubs, gallops, thrills, or heaves Abdomen: soft, nontender; no hepatosplenomehaly, BS+; abdominal aorta nontender and not dilated by palpation. Back: no CVA tenderness Pulses 2+ Musculoskeletal: full range of motion, normal strength, no joint deformities Extremities: Trace bilateral ankle edema ;no clubbing cyanosis, Homan's sign negative  Neurologic: grossly nonfocal; Cranial nerves grossly  wnl Psychologic: Normal mood and affect   Studies/Labs Reviewed:   EKG:  EKG is ordered today.  ECG (independently read by me): Normal sinus rhythm at 72 bpm.  Nondiagnostic T-wave in lead 3.  Normal intervals.  Recent Labs: BMP Latest Ref Rng & Units 02/16/2017  Glucose 65 - 99 mg/dL 86  BUN 7 - 25 mg/dL 13  Creatinine 0.50 - 1.05 mg/dL 0.76  Sodium 135 - 146 mmol/L 139  Potassium 3.5 - 5.3 mmol/L 4.8  Chloride 98 - 110 mmol/L 104  CO2 20 - 31 mmol/L 27  Calcium 8.6 - 10.4 mg/dL 9.2     Hepatic Function Latest Ref Rng & Units 02/16/2017  Total Protein 6.1 - 8.1 g/dL 6.9  Albumin 3.6 - 5.1 g/dL 3.8  AST 10 - 35 U/L 19  ALT 6 - 29 U/L 22  Alk Phosphatase 33 - 130 U/L 166(H)  Total Bilirubin 0.2 - 1.2 mg/dL 0.5    CBC Latest Ref Rng & Units 02/16/2017  WBC 3.8 - 10.8 K/uL 8.2  Hemoglobin 11.7 - 15.5 g/dL 13.0  Hematocrit 35.0 - 45.0 % 41.4  Platelets 140 - 400 K/uL 402(H)   Lab Results  Component Value Date   MCV 87.0 02/16/2017   Lab Results  Component Value Date   TSH 3.51 02/16/2017   No results found for: HGBA1C   BNP No results found for: BNP  ProBNP No results found for: PROBNP   Lipid Panel     Component Value Date/Time   CHOL 191 02/16/2017 1030   TRIG 62 02/16/2017 1030   HDL 86 02/16/2017 1030   CHOLHDL 2.2 02/16/2017 1030   VLDL 12 02/16/2017 1030   LDLCALC 93 02/16/2017 1030     RADIOLOGY: No results found.   Additional studies/ records that were reviewed today include:  I have reviewed her records from [place]:    1. Exertional chest pain   2. Family history of early CAD   3. Elevated C-reactive protein (CRP)   4. Medication management   5. Seasonal allergic rhinitis, unspecified trigger      PLAN:  Donna Armstrong is a 51 year old female who is a strong family history for CAD with the mother having history of hypertension and is status post pacemaker insertion, her father who had myocardial infarctions 2, and  prior bypass surgery as well as a grandfather who suffered a heart attack prematurely.  She had recently experienced the development of exertionally precipitated chest tightness.  She had experienced an initial  episode one year ago, but had not been very active until recently when she has increased her walking and has noticed recurrent symptomatology.  She experiences the symptoms with walking up hill in her driveway and also while climbing steps.  Typically these episodes last only several minutes, but abate when she stops walking.  There is no history of hypertension, tobacco use, or awareness of hyperlipidemia.  I reviewed her recent laboratory as well as her nuclear perfusion study with her in detail.  The nuclear study demonstrated development of mild ST segment depression after 6 minutes of walking with prompt resolution at 7 minutes.  Scintigraphic images showed normal perfusion with the exception of a very small region of possible distal inferior ischemia.  Ejection fraction was 73% and there were no wall motion abnormalities noted.  The study study was interpreted as low risk.  I discussed with her that is possible that she may have very distal disease contributed to her symptomatology versus possible attenuation artifact contributing to the apical abnormality.  I previously given her prescription for the metoprolol but she did not start this since her stress test was scheduled shortly after her office visit, and she would've needed to hold metoprolol for 48 hours.  I have now recommended institution of metoprolol at 12.5 mg twice a day for 3 days and then she will increase this to 25 mg twice a day.  She will also start aspirin 81 mg.  She has a history of an elevated C-reactive protein, which should be beneficial.  She has been walking area.  We discussed weight loss.she has asthma and takes Xopenex as needed.  She has seasonal allergies for which she takes Flonase and Zyrtec.  I will see her in 2 months  for reevaluation and further recommendations will made at that time.   Medication Adjustments/Labs and Tests Ordered: Current medicines are reviewed at length with the patient today.  Concerns regarding medicines are outlined above.  Medication changes, Labs and Tests ordered today are listed in the Patient Instructions below. Patient Instructions  medication change   Start aspirin 81 mg enteric coated ( baby) one tablet daily   start  metoprolol tartrate 12.5 mg ( 1/2 tablet of 25 mg ) twice a day for 3 days ,then increase to 25 mg  ( one tablet) twice a day.  (LAB AND TEST RESULTS ROUTED TO PRIMARY)   No other changes   Your physician recommends that you schedule a follow-up appointment in 2- 3 months with Dr Claiborne Billings.    If you need a refill on your cardiac medications before your next appointment, please call your pharmacy.         Signed, Shelva Majestic, MD  03/01/2017 7:22 PM    [facility] [address] [place], [place], [place]  [address] Phone: [phone]

## 2017-02-27 NOTE — Patient Instructions (Addendum)
medication change   Start aspirin 81 mg enteric coated ( baby) one tablet daily   start  metoprolol tartrate 12.5 mg ( 1/2 tablet of 25 mg ) twice a day for 3 days ,then increase to 25 mg  ( one tablet) twice a day.  (LAB AND TEST RESULTS ROUTED TO PRIMARY)   No other changes   Your physician recommends that you schedule a follow-up appointment in 2- 3 months with Dr Claiborne Billings.    If you need a refill on your cardiac medications before your next appointment, please call your pharmacy.

## 2017-05-16 ENCOUNTER — Encounter: Payer: Self-pay | Admitting: Student

## 2017-05-16 ENCOUNTER — Ambulatory Visit (INDEPENDENT_AMBULATORY_CARE_PROVIDER_SITE_OTHER): Payer: 59 | Admitting: Student

## 2017-05-16 VITALS — BP 100/74 | HR 76 | Ht 63.0 in | Wt 173.2 lb

## 2017-05-16 DIAGNOSIS — Z8249 Family history of ischemic heart disease and other diseases of the circulatory system: Secondary | ICD-10-CM

## 2017-05-16 DIAGNOSIS — R002 Palpitations: Secondary | ICD-10-CM

## 2017-05-16 DIAGNOSIS — R079 Chest pain, unspecified: Secondary | ICD-10-CM

## 2017-05-16 DIAGNOSIS — J302 Other seasonal allergic rhinitis: Secondary | ICD-10-CM | POA: Diagnosis not present

## 2017-05-16 NOTE — Progress Notes (Signed)
Cardiology Office Note    Date:  05/16/2017   ID:  Donna, Armstrong 1966/03/25, MRN [id number]  PCP:  Donna Amel, MD  Cardiologist: Dr. Claiborne Billings   Chief Complaint  Patient presents with  . Follow-up    Seen for Dr. Claiborne Billings; 2 month appointment    History of Present Illness:    Donna Armstrong is a 51 y.o. female with past medical history of asthma and family history of CAD who presents to the office today for 13-monthfollow-up.   She was last examined by Dr. KClaiborne Billingsin 02/2017 and reported episodes of chest pain with exertion and associated dyspnea. She underwent stress testing which showed ST depression but images showed only a small region of distal inferior ischemia vs. artifact and overall the study was low-risk. She was started on Lopressor 12.573mBID and ASA 8146maily.  In talking with the patient today, she denies any repeat episodes of exertional chest pressure. She has been exercising daily and is getting in 10,000 steps per day, mostly walking along her 500 foot driveway several times per day. She reports this has a hill and she does not experience chest pressure when climbing this. No recent orthopnea, PND, or lower extremity edema.  She initially experienced fatigue when starting Lopressor but reports this has resolved. She never titrated the medication to 25 mg BID due to episodes of hypotension. She has noticed improvement in her palpitations since starting the medication. Has been monitoring her HR through a Fit-bit and HR has mostly been in the 60's - 90's, peaking in the 120's with exercise. No episodes of bradycardia noted.   Past Medical History:  Diagnosis Date  . Allergic rhinitis    DR. [place] Anal fissure   . Anemia   . Asthma   . Bilateral leg edema   . Chest pain    a. 02/2017: stress test with ST depression but images showed only a small region of distal inferior ischemia vs. artifact. Overall, the study was low-risk.  . HMarland Kitchenmorrhoids   . Joint pain     . Menorrhagia   . Rectal bleeding   . Varicose veins     Past Surgical History:  Procedure Laterality Date  . CESAREAN SECTION     X2  . KIDNEY SURGERY     kidney/bladder - exploratory   . TONSILLECTOMY      Current Medications: Outpatient Medications Prior to Visit  Medication Sig Dispense Refill  . aspirin 81 MG tablet Take 81 mg by mouth daily.    . Calcium Carb-Cholecalciferol (CALCIUM 1000 + D PO) Take 1,000 mg by mouth daily.    . cetirizine (ZYRTEC) 10 MG tablet Take 10 mg by mouth daily.    . fluticasone (FLONASE) 50 MCG/ACT nasal spray Place into both nostrils daily.    . lMarland Kitchenvalbuterol (XOPENEX HFA) 45 MCG/ACT inhaler Inhale 2 puffs into the lungs every 4 (four) hours as needed for wheezing.    . levalbuterol (XOPENEX) 1.25 MG/0.5ML nebulizer solution Take 1.25 mg by nebulization every 4 (four) hours as needed for wheezing or shortness of breath.    . Multiple Vitamin (MULTIVITAMIN) tablet Take 1 tablet by mouth daily.    . metoprolol tartrate (LOPRESSOR) 25 MG tablet Take 1 tablet (25 mg total) by mouth 2 (two) times daily. 60 tablet 6   No facility-administered medications prior to visit.      Allergies:   Ketek [telithromycin] and Erythromycin   Social History  Social History  . Marital status: Single    Spouse name: N/A  . Number of children: N/A  . Years of education: N/A   Occupational History  . COMPUTER PROGRAMMER    Social History Main Topics  . Smoking status: Never Smoker  . Smokeless tobacco: Never Used  . Alcohol use No  . Drug use: No  . Sexual activity: Not Asked   Other Topics Concern  . None   Social History Narrative  . None     Family History:  The patient's family history includes Heart attack in her father; Heart attack (age of onset: 39) in her paternal grandfather; Heart disease in her father and mother; Hyperlipidemia in her father and mother; Hypertension in her father and mother; Varicose Veins in her mother.   Review of  Systems:   Please see the history of present illness.     General:  No chills, fever, night sweats or weight changes.  Cardiovascular:  No chest pain, edema, orthopnea, palpitations, paroxysmal nocturnal dyspnea. Positive for dyspnea on exertion.  Dermatological: No rash, lesions/masses Respiratory: No cough, dyspnea Urologic: No hematuria, dysuria Abdominal:   No nausea, vomiting, diarrhea, bright red blood per rectum, melena, or hematemesis Neurologic:  No visual changes, wkns, changes in mental status.  All other systems reviewed and are otherwise negative except as noted above.   Physical Exam:    VS:  BP 100/74   Pulse 76   Ht 5' 3"  (1.6 m)   Wt 173 lb 3.2 oz (78.6 kg)   BMI 30.68 kg/m    General: Well developed, well nourished Caucasian female appearing in no acute distress. Head: Normocephalic, atraumatic, sclera non-icteric, no xanthomas, nares are without discharge.  Neck: No carotid bruits. JVD not elevated.  Lungs: Respirations regular and unlabored, without wheezes or rales.  Heart: Regular rate and rhythm. No S3 or S4.  No murmur, no rubs, or gallops appreciated. Abdomen: Soft, non-tender, non-distended with normoactive bowel sounds. No hepatomegaly. No rebound/guarding. No obvious abdominal masses. Msk:  Strength and tone appear normal for age. No joint deformities or effusions. Extremities: No clubbing or cyanosis. No lower extremity edema.  Distal pedal pulses are 2+ bilaterally. Neuro: Alert and oriented X 3. Moves all extremities spontaneously. No focal deficits noted. Psych:  Responds to questions appropriately with a normal affect. Skin: No rashes or lesions noted  Wt Readings from Last 3 Encounters:  05/16/17 173 lb 3.2 oz (78.6 kg)  02/27/17 175 lb 3.2 oz (79.5 kg)  02/23/17 177 lb (80.3 kg)     Studies/Labs Reviewed:   EKG:  EKG is not ordered today.   Recent Labs: 02/16/2017: ALT 22; BUN 13; Creat 0.76; Hemoglobin 13.0; Platelets 402; Potassium 4.8;  Sodium 139; TSH 3.51   Lipid Panel    Component Value Date/Time   CHOL 191 02/16/2017 1030   TRIG 62 02/16/2017 1030   HDL 86 02/16/2017 1030   CHOLHDL 2.2 02/16/2017 1030   VLDL 12 02/16/2017 1030   LDLCALC 93 02/16/2017 1030    Additional studies/ records that were reviewed today include:   NST: 02/23/2017  Nuclear stress EF: 73%.  Electrically positive for ischemia though specificity is limited as ST changes transient during exercise  Very small region of distal inferior ischemia  Low risk study  Assessment:    1. Exertional chest pain   2. Family history of early CAD   3. Seasonal allergic rhinitis, unspecified trigger   4. Heart palpitations  Plan:   In order of problems listed above:  1. Exertional Chest Pain - has been followed by Dr. Claiborne Billings for episodes of exertional chest discomfort with recent NST in 02/2017 showing ST depression but images showing no significant ischemia. She has known family history of CAD but denies any history of HTN, HLD, Type 2 DM, or prior tobacco use.  - she denies any repeat episodes of exertional chest pressure. Has been exercising daily and is getting in 10,000 steps per day, mostly walking along her 500 foot driveway several times per day.  - overall, her symptoms have significantly improved. Continue with ASA and BB therapy. Discussed with the patient that if she develops recurrent symptoms, a Coronary CT could be obtained for further evaluation.   2. Asthma - currently uses Albuterol as needed. Has used Xopenex in the past and I recommended this was likely most beneficial in preventing recurrent palpitations or rebound tachycardia.  3. Palpitations  - the patient reports occasional episodes of her heart "fluttering" for seconds at a time over the past few years, denying any recurrence since being started on BB therapy. - symptoms seem most consistent with PAC's/PVC's. - continue Lopressor 12.75m BID.    Medication  Adjustments/Labs and Tests Ordered: Current medicines are reviewed at length with the patient today.  Concerns regarding medicines are outlined above.  Medication changes, Labs and Tests ordered today are listed in the Patient Instructions below. Patient Instructions  Continue same medications  Your physician wants you to follow-up in: 1 year with Dr.Kelly. You will receive a reminder letter in the mail two months in advance. If you don't receive a letter, please call our office to schedule the follow-up appointment.   Signed, BErma Heritage PA-C  05/16/2017 1:07 PM    [facility] [address] [place]  [id number]: [phone] Fax: [phone] [address] [place] [address]Phone: [phone]

## 2017-05-16 NOTE — Patient Instructions (Signed)
Continue same medications    Your physician wants you to follow-up in: 1 year with Dr.Kelly. You will receive a reminder letter in the mail two months in advance. If you don't receive a letter, please call our office to schedule the follow-up appointment.

## 2017-06-14 ENCOUNTER — Other Ambulatory Visit: Payer: Self-pay | Admitting: *Deleted

## 2017-06-14 MED ORDER — METOPROLOL TARTRATE 25 MG PO TABS
12.5000 mg | ORAL_TABLET | Freq: Two times a day (BID) | ORAL | 3 refills | Status: DC
Start: 2017-06-14 — End: 2023-01-01

## 2018-03-20 ENCOUNTER — Encounter (HOSPITAL_COMMUNITY): Payer: Self-pay | Admitting: Emergency Medicine

## 2018-03-20 ENCOUNTER — Emergency Department (HOSPITAL_COMMUNITY)
Admission: EM | Admit: 2018-03-20 | Discharge: 2018-03-20 | Disposition: A | Discharge status: Home / Self Care | Payer: 59 | Attending: Emergency Medicine | Admitting: Emergency Medicine

## 2018-03-20 DIAGNOSIS — Z7982 Long term (current) use of aspirin: Secondary | ICD-10-CM | POA: Diagnosis not present

## 2018-03-20 DIAGNOSIS — R1031 Right lower quadrant pain: Secondary | ICD-10-CM | POA: Diagnosis not present

## 2018-03-20 DIAGNOSIS — L509 Urticaria, unspecified: Secondary | ICD-10-CM | POA: Insufficient documentation

## 2018-03-20 DIAGNOSIS — J45909 Unspecified asthma, uncomplicated: Secondary | ICD-10-CM | POA: Diagnosis not present

## 2018-03-20 HISTORY — DX: Disorder of thyroid, unspecified: E07.9

## 2018-03-20 LAB — COMPREHENSIVE METABOLIC PANEL
ALBUMIN: 3.8 g/dL (ref 3.5–5.0)
ALK PHOS: 171 U/L — AB (ref 38–126)
ALT: 15 U/L (ref 14–54)
ANION GAP: 8 (ref 5–15)
AST: 17 U/L (ref 15–41)
BUN: 17 mg/dL (ref 6–20)
CALCIUM: 8.8 mg/dL — AB (ref 8.9–10.3)
CO2: 26 mmol/L (ref 22–32)
Chloride: 104 mmol/L (ref 101–111)
Creatinine, Ser: 0.95 mg/dL (ref 0.44–1.00)
GFR calc Af Amer: >60 mL/min (ref >60)
GFR calc non Af Amer: >60 mL/min (ref >60)
GLUCOSE: 107 mg/dL — AB (ref 65–99)
Potassium: 3.7 mmol/L (ref 3.5–5.1)
SODIUM: 138 mmol/L (ref 135–145)
Total Bilirubin: 0.4 mg/dL (ref 0.3–1.2)
Total Protein: 7.5 g/dL (ref 6.5–8.1)

## 2018-03-20 LAB — URINALYSIS, ROUTINE W REFLEX MICROSCOPIC
BILIRUBIN URINE: NEGATIVE
GLUCOSE, UA: NEGATIVE mg/dL
KETONES UR: NEGATIVE mg/dL
LEUKOCYTES UA: NEGATIVE
NITRITE: NEGATIVE
PH: 5 (ref 5.0–8.0)
Protein, ur: NEGATIVE mg/dL
Specific Gravity, Urine: 1.023 (ref 1.005–1.030)

## 2018-03-20 LAB — POC URINE PREG, ED: Preg Test, Ur: NEGATIVE

## 2018-03-20 LAB — CBC
HEMATOCRIT: 44.1 % (ref 36.0–46.0)
HEMOGLOBIN: 13.8 g/dL (ref 12.0–15.0)
MCH: 27 pg (ref 26.0–34.0)
MCHC: 31.3 g/dL (ref 30.0–36.0)
MCV: 86.3 fL (ref 78.0–100.0)
Platelets: 393 10*3/uL (ref 150–400)
RBC: 5.11 MIL/uL (ref 3.87–5.11)
RDW: 13.8 % (ref 11.5–15.5)
WBC: 10.3 10*3/uL (ref 4.0–10.5)

## 2018-03-20 LAB — DIFFERENTIAL
Abs Immature Granulocytes: 0 10*3/uL (ref 0.0–0.1)
Basophils Absolute: 0 10*3/uL (ref 0.0–0.1)
Basophils Relative: 0 %
Eosinophils Absolute: 0.1 10*3/uL (ref 0.0–0.7)
Eosinophils Relative: 1 %
Immature Granulocytes: 0 %
LYMPHS ABS: 2.8 10*3/uL (ref 0.7–4.0)
LYMPHS PCT: 27 %
MONO ABS: 0.9 10*3/uL (ref 0.1–1.0)
MONOS PCT: 9 %
NEUTROS ABS: 6.3 10*3/uL (ref 1.7–7.7)
NEUTROS PCT: 63 %

## 2018-03-20 LAB — LIPASE, BLOOD: Lipase: 46 U/L (ref 11–51)

## 2018-03-20 MED ORDER — DIPHENHYDRAMINE HCL 50 MG/ML IJ SOLN
25.0000 mg | Freq: Once | INTRAMUSCULAR | Status: AC
  Administered 2018-03-20: 25 mg via INTRAVENOUS
  Filled 2018-03-20: qty 1

## 2018-03-20 MED ORDER — PREDNISONE 50 MG PO TABS: 50.0000 mg | ORAL_TABLET | Freq: Every day | ORAL | 0 refills | Status: DC

## 2018-03-20 MED ORDER — METHYLPREDNISOLONE SODIUM SUCC 125 MG IJ SOLR
125.0000 mg | Freq: Once | INTRAMUSCULAR | Status: AC
  Administered 2018-03-20: 125 mg via INTRAVENOUS
  Filled 2018-03-20: qty 2

## 2018-03-20 MED ORDER — FAMOTIDINE IN NACL 20-0.9 MG/50ML-% IV SOLN
20.0000 mg | Freq: Once | INTRAVENOUS | Status: AC
  Administered 2018-03-20: 20 mg via INTRAVENOUS
  Filled 2018-03-20: qty 50

## 2018-03-20 NOTE — ED Provider Notes (Signed)
[facility] Provider Note   CSN: [id number] Arrival date & time: 03/20/18  0225     History   Chief Complaint Chief Complaint  Patient presents with  . Abdominal Pain  . Urticaria    HPI Donna Armstrong is a 52 y.o. female.  The history is provided by the patient.  She has a history of asthma, chronic venous insufficiency, hypothyroidism and comes in with complaints of right lower quadrant pain and hives.  Abdominal pain is dull and she rates it at 3/10.  There is some radiation toward the back.  There has been some anorexia but no nausea or vomiting.  Nothing makes the pain better, nothing makes it worse.  She denies any urinary urgency, frequency, tenesmus, dysuria.  Her last menses was 3 days ago and she is just finishing her menstrual bleeding.  She uses condoms for contraception and denies unprotected intercourse.  Hives started this evening with a couple of spots on her arms.  She woke up covered in hives and with intense itching.  Hives are worst on her legs.  She denies apical to breathing or swallowing, but does feel like there is a lump in her throat.  She has never had hives before although she does have environmental allergies.  She is on no medications other than thyroid replacement.  She has not had any new exposures.  She did take half of a diphenhydramine at home, with no relief.  Past Medical History:  Diagnosis Date  . Allergic rhinitis    DR. [place]  . Anal fissure   . Anemia   . Asthma   . Bilateral leg edema   . Chest pain    a. 02/2017: stress test with ST depression but images showed only a small region of distal inferior ischemia vs. artifact. Overall, the study was low-risk.  Marland Kitchen Hemorrhoids   . Joint pain   . Menorrhagia   . Rectal bleeding   . Thyroid disease   . Varicose veins     Patient Active Problem List   Diagnosis Date Noted  . Heart palpitations 05/16/2017  . Chronic venous insufficiency 07/31/2014  .  Varicose veins of both lower extremities with complications 63/87/5643    Past Surgical History:  Procedure Laterality Date  . ABDOMINAL SURGERY    . CESAREAN SECTION     X2  . KIDNEY SURGERY     kidney/bladder - exploratory   . TONSILLECTOMY       OB History   None      Home Medications    Prior to Admission medications   Medication Sig Start Date End Date Taking? Authorizing Provider  aspirin 81 MG tablet Take 81 mg by mouth daily.    [provider]  Calcium Carb-Cholecalciferol (CALCIUM 1000 + D PO) Take 1,000 mg by mouth daily.    [provider]  cetirizine (ZYRTEC) 10 MG tablet Take 10 mg by mouth daily.    [provider]  fluticasone (FLONASE) 50 MCG/ACT nasal spray Place into both nostrils daily.    [provider]  levalbuterol Penne Lash HFA) 45 MCG/ACT inhaler Inhale 2 puffs into the lungs every 4 (four) hours as needed for wheezing.    [provider]  levalbuterol (XOPENEX) 1.25 MG/0.5ML nebulizer solution Take 1.25 mg by nebulization every 4 (four) hours as needed for wheezing or shortness of breath.    [provider]  metoprolol tartrate (LOPRESSOR) 25 MG tablet Take 0.5 tablets (12.5  mg total) by mouth 2 (two) times daily. 06/14/17   Troy Sine, MD  Multiple Vitamin (MULTIVITAMIN) tablet Take 1 tablet by mouth daily.    [provider]    Family History Family History  Problem Relation Age of Onset  . Heart disease Mother   . Hyperlipidemia Mother   . Hypertension Mother   . Varicose Veins Mother   . Heart disease Father   . Hyperlipidemia Father   . Hypertension Father   . Heart attack Father        73 & 70  . Heart attack Paternal Grandfather 75    Social History Social History   Tobacco Use  . Smoking status: Never Smoker  . Smokeless tobacco: Never Used  Substance Use Topics  . Alcohol use: No  . Drug use: No     Allergies   Ketek [telithromycin]; Tape; and  Erythromycin   Review of Systems Review of Systems  All other systems reviewed and are negative.    Physical Exam Updated Vital Signs BP 107/73 (BP Location: Right Arm)   Pulse 92   Temp 98 F (36.7 C) (Oral)   Resp 16   Ht 5' 3"  (1.6 m)   Wt 70.3 kg (155 lb)   LMP 03/15/2018 (Approximate)   SpO2 100%   BMI 27.46 kg/m   Physical Exam  Nursing note and vitals reviewed.  52 year old female, resting comfortably and in no acute distress. Vital signs are normal. Oxygen saturation is 100%, which is normal. Head is normocephalic and atraumatic. PERRLA, EOMI. Oropharynx is clear.  Mild edema of the uvula is noted.  Urticarial lesions are seen, and there is slight puffiness of the face and lips. Neck is nontender and supple without adenopathy or JVD. Back is nontender and there is no CVA tenderness. Lungs are clear without rales, wheezes, or rhonchi. Chest is nontender. Heart has regular rate and rhythm without murmur. Abdomen is soft, flat, with mild right lower quadrant tenderness present.  There is no rebound or guarding.  There are no masses or hepatosplenomegaly and peristalsis is normoactive.  Urticarial lesions are seen and tend to be confluent in the lower abdomen. Extremities have trace edema, full range of motion is present.  Diffuse urticaria seen which is worse on the legs with generalized confluence. Skin is warm and dry. Neurologic: Mental status is normal, cranial nerves are intact, there are no motor or sensory deficits.  ED Treatments / Results  Labs (all labs ordered are listed, but only abnormal results are displayed) Labs Reviewed  COMPREHENSIVE METABOLIC PANEL - Abnormal; Notable for the following components:      Result Value   Glucose, Bld 107 (*)    Calcium 8.8 (*)    Alkaline Phosphatase 171 (*)    All other components within normal limits  URINALYSIS, ROUTINE W REFLEX MICROSCOPIC - Abnormal; Notable for the following components:   Hgb urine dipstick  MODERATE (*)    Bacteria, UA RARE (*)    All other components within normal limits  LIPASE, BLOOD  CBC  DIFFERENTIAL  POC URINE PREG, ED   Procedures Procedures  Medications Ordered in ED Medications  diphenhydrAMINE (BENADRYL) injection 25 mg (has no administration in time range)  methylPREDNISolone sodium succinate (SOLU-MEDROL) 125 mg/2 mL injection 125 mg (has no administration in time range)  famotidine (PEPCID) IVPB 20 mg premix (has no administration in time range)     Initial Impression / Assessment and Plan / ED Course  I have reviewed the triage vital signs and the nursing notes.  Pertinent lab results that were available during my care of the patient were reviewed by me and considered in my medical decision making (see chart for details).  Urticaria of uncertain trigger.  She will be given diphenhydramine, famotidine, methylprednisolone.  Abdominal pain which does not seem to be severe.  Low index of suspicion for acute appendicitis.  Urinalysis being obtained to look for evidence of UTI.  Screening labs have been ordered.  Old records are reviewed, and she has no relevant past visits.  5:19 AM Itching is much improved.  Facial swelling has decreased.  Patient states her abdominal pain is slightly better.  On reexam, there is still mild tenderness in the right lower quadrant, but certainly not more tender than it had been originally.  Labs are reassuring.  Still no indication for advanced imaging.  I have discussed with her need for reevaluation if pain gets worse, or if it persists beyond 24 hours.  She is discharged with prescription for prednisone.  Recommended using a second-generation antihistamine.  She has Allegra at home, so she is advised to take that twice a day.  Advised to take diphenhydramine for breakthrough itching.  She has an allergist, so she can discuss merits of allergy testing with him.  Final Clinical Impressions(s) / ED Diagnoses   Final diagnoses:   Urticaria  RLQ abdominal pain    ED Discharge Orders        Ordered    predniSONE (DELTASONE) 50 MG tablet  Daily     16/10/96 0454       Delora Fuel, MD 09/81/19 Delrae Rend

## 2018-03-20 NOTE — Discharge Instructions (Addendum)
Take your Allegra twice a day.  Take diphenhydramine (Benadryl) as needed for itching not controlled by Allegra.  Take either ranitidine (Zantac) or famotidine (Pepcid AC) twice a day.  These are also a type of antihistamine and will help keep the hives under control.  If your abdominal pain is getting worse, or if it is not getting better over the next 24 hours, you should be rechecked.  That can be done either at your primary care provider's office, or in the emergency department.

## 2018-03-20 NOTE — ED Triage Notes (Addendum)
Patient reports RLQ pain onset yesterday , denies emesis or diarrhea , pt. added generalized skin itching with hives onset this evening , airway intact /no oral swelling or SOB . Patient mentioned that she was stung by a bee last week .

## 2018-03-20 NOTE — ED Notes (Signed)
ED Provider at bedside. 

## 2020-04-08 ENCOUNTER — Other Ambulatory Visit: Payer: Self-pay | Admitting: Family Medicine

## 2020-04-08 DIAGNOSIS — R748 Abnormal levels of other serum enzymes: Secondary | ICD-10-CM

## 2020-04-16 ENCOUNTER — Ambulatory Visit
Admission: RE | Admit: 2020-04-16 | Discharge: 2020-04-16 | Disposition: A | Discharge status: Home / Self Care | Payer: 59 | Source: Ambulatory Visit | Attending: Family Medicine | Admitting: Family Medicine

## 2020-04-16 DIAGNOSIS — R748 Abnormal levels of other serum enzymes: Secondary | ICD-10-CM

## 2020-06-06 NOTE — Progress Notes (Signed)
New Patient Note  RE: Donna Armstrong MRN: [id number] DOB: May 07, 1966 Date of Office Visit: 06/07/2020  Referring provider: Lujean Amel, MD Primary care provider: Lujean Amel, MD  Chief Complaint: Allergy Testing (would like to start back on allergy shots)  History of Present Illness: I had the pleasure of seeing Donna Armstrong for initial evaluation at the [facility] on 06/08/2020. She is a 54 y.o. female, who is self-referred here  for the evaluation of re-establishing care with an allergist. Patient was seen by [facility] in the past.   She reports symptoms of nasal congestion mainly. Symptoms have been going on for 10+ years. The symptoms are present all year around. Other triggers include exposure to crawlspace, mold. Anosmia: no. Headache: no. She has used Flonase 1 spray BID with some improvement in symptoms. Sinus infections: no. Previous work up includes: last skin testing was done over 3 years ago which showed multiple positives including dust mites, cat and mold per patient report - records requested.  Patient was on allergy injections off and on for many years with some benefit. No injection for over 1 year now.  Large localized reactions to the dust mite injection in the past.   Previous ENT evaluation: not recently. Previous sinus imaging: no. History of nasal polyps: no. History of reflux: no.  Assessment and Plan: Donna Armstrong is a 54 y.o. female with: Other allergic rhinitis Perennial rhinitis symptoms for 10+ years. Was on allergy immunotherapy in the past with good benefit and interested in re-starting. Last skin testing was over 3 years ago - in process of obtaining records. No recent ENT evaluation.  Today's skin testing showed: Positive to dust mites, cat and mold.  Awaiting previous allergist's notes.   Start environmental control measures as below.  May use Flonase (fluticasone) nasal spray 1 spray per nostril twice a day as needed for  nasal congestion.   Nasal saline spray (i.e., Simply Saline) or nasal saline lavage (i.e., NeilMed) is recommended as needed and prior to medicated nasal sprays.  Had a detailed discussion with patient/family that clinical history is suggestive of allergic rhinitis, and may benefit from allergy immunotherapy (AIT). Discussed in detail regarding the dosing, schedule, side effects (mild to moderate local allergic reaction and rarely systemic allergic reactions including anaphylaxis), and benefits (significant improvement in nasal symptoms, seasonal flares of asthma) of immunotherapy with the patient. There is significant time commitment involved with allergy shots, which includes weekly immunotherapy injections for first 9-12 months and then biweekly to monthly injections for 3-5 years.   Consent was signed.  Start allergy injections.  Take an antihistamine medicine on the days of allergy injections.  I have prescribed epinephrine injectable and demonstrated proper use. For mild symptoms you can take over the counter antihistamines such as Benadryl and monitor symptoms closely. If symptoms worsen or if you have severe symptoms including breathing issues, throat closure, significant swelling, whole body hives, severe diarrhea and vomiting, lightheadedness then inject epinephrine and seek immediate medical care afterwards.  Emergency action plan given.   Mild intermittent reactive airway disease Exercise and URIs tend to trigger her asthma. Uses albuterol as needed with good benefit. In January 2021 had to use nebulizer due to her symptoms. Advair Diskus caused coughing in the past.   Today's spirometry was unremarkable.  . Daily controller medication(s): none. . Prior to physical activity: May use albuterol rescue inhaler 2 puffs 5 to 15 minutes prior to strenuous physical activities. Marland Kitchen Rescue medications:  May use albuterol rescue inhaler 2 puffs or nebulizer every 4 to 6 hours as needed for  shortness of breath, chest tightness, coughing, and wheezing. Monitor frequency of use.  . During upper respiratory infections/asthma flares: start Dulera 177mg 2 puffs twice a day with spacer and rinse mouth afterwards for 1-2 weeks until your breathing symptoms return to baseline.   Return in about 4 months (around 10/07/2020).  Meds ordered this encounter  Medications  . mometasone-formoterol (DULERA) 100-5 MCG/ACT AERO    Sig: Inhale 2 puffs into the lungs in the morning and at bedtime. Use during upper respiratory infections for 1-2 weeks.    Dispense:  1 each    Refill:  3  . albuterol (VENTOLIN HFA) 108 (90 Base) MCG/ACT inhaler    Sig: Inhale 2 puffs into the lungs every 6 (six) hours as needed for wheezing or shortness of breath (coughing fits).    Dispense:  18 g    Refill:  2  . EPINEPHrine 0.3 mg/0.3 mL IJ SOAJ injection    Sig: Inject 0.3 mLs (0.3 mg total) into the muscle once for 1 dose. As needed for life-threatening allergic reactions    Dispense:  2 each    Refill:  1   Other allergy screening: Asthma: yes  Patient had EIB as a child and generally doesn't need an inhaler unless doing aerobic exercises or has URI. She reports symptoms of chest tightness, shortness of breath, coughing, wheezing, nocturnal awakenings for many years. Current medications include albuterol prn which help. She tried the following inhalers: Advair Diskus which caused coughing. Main triggers are  infections, weather changes, exercise. In the last month, frequency of symptoms: 0x/week. Frequency of nocturnal symptoms: 0x/month. Frequency of SABA use: 0x/week. Interference with physical activity: sometimes. Sleep is undisturbed. In the last 12 months, emergency room visits/urgent care visits/doctor office visits or hospitalizations due to respiratory issues: one. In the last 12 months, oral steroids courses: one. Lifetime history of hospitalization for respiratory issues: as a very [place] child. Prior  intubations: no. History of pneumonia: no. She was evaluated by allergist in the past. Smoking exposure: denies. Up to date with flu vaccine: yes. Up to date with COVID-19 vaccine: yes.   Patient has a bad URI in January 2021 and required nebulizer use at home.  Food allergy: no Medication allergy: yes Hymenoptera allergy: no Urticaria: no Eczema:no History of recurrent infections suggestive of immunodeficency: no  Diagnostics: Spirometry:  Tracings reviewed. Her effort: Good reproducible efforts. FVC: 2.45L FEV1: 2.07L, 75% predicted FEV1/FVC ratio: 84% Interpretation: Spirometry consistent with possible restrictive disease.  Please see scanned spirometry results for details.  Skin Testing: Environmental allergy panel. Positive test to: dust mites, cat and mold. Results discussed with patient/family.  Airborne Adult Perc - 06/07/20 1517    Time Antigen Placed 1517    Allergen Manufacturer GLavella Hammock   Location Back    Number of Test 59    Panel 1 Select    1. Control-Buffer 50% Glycerol Negative    2. Control-Histamine 1 mg/ml 2+    3. Albumin saline Negative    4. BBoydNegative    5. BGuatemalaNegative    6. Johnson Negative    7. KPine MountainBlue Negative    8. Meadow Fescue Negative    9. Perennial Rye Negative    10. Sweet Vernal Negative    11. Timothy Negative    12. Cocklebur Negative    13. Burweed Marshelder Negative    14.  Ragweed, short Negative    15. Ragweed, Giant Negative    16. Plantain,  English Negative    17. Lamb's Quarters Negative    18. Sheep Sorrell Negative    19. Rough Pigweed Negative    20. Marsh Elder, Rough Negative    21. Mugwort, Common Negative    22. Ash mix Negative    23. Birch mix Negative    24. Beech American Negative    25. Box, Elder Negative    26. Cedar, red Negative    27. Cottonwood, [place] Negative    28. Elm mix Negative    29. Hickory Negative    30. Maple mix Negative    31. Oak, [place] mix Negative    32. Pecan  Pollen Negative    33. Pine mix Negative    34. Sycamore Eastern Negative    35. [place], Black Pollen Negative    36. Alternaria alternata Negative    37. Cladosporium Herbarum Negative    38. Aspergillus mix Negative    39. Penicillium mix Negative    40. Bipolaris sorokiniana (Helminthosporium) Negative    41. Drechslera spicifera (Curvularia) Negative    42. Mucor plumbeus Negative    43. Fusarium moniliforme Negative    44. Aureobasidium pullulans (pullulara) Negative    45. Rhizopus oryzae Negative    46. Botrytis cinera Negative    47. Epicoccum nigrum Negative    48. Phoma betae Negative    49. Candida Albicans Negative    50. Trichophyton mentagrophytes Negative    51. Mite, D Farinae  5,000 AU/ml 3+    52. Mite, D Pteronyssinus  5,000 AU/ml 2+    53. Cat Hair 10,000 BAU/ml 2+    54.  Dog Epithelia Negative    55. Mixed Feathers Negative    56. Horse Epithelia Negative    57. Cockroach, German Negative    58. Mouse Negative    59. Tobacco Leaf Negative          Intradermal - 06/07/20 1600    Time Antigen Placed 0430    Allergen Manufacturer Lavella Hammock    Location Arm    Number of Test 13    Control Negative    Guatemala Negative    Johnson Negative    7 Grass Negative    Ragweed mix Negative    Weed mix Negative    Tree mix Negative    Mold 1 Negative    Mold 2 Negative    Mold 3 Negative    Mold 4 2+    Cat Omitted    Dog Negative    Cockroach Negative           Past Medical History: Patient Active Problem List   Diagnosis Date Noted  . Mild intermittent reactive airway disease 06/07/2020  . Other allergic rhinitis 06/07/2020  . Heart palpitations 05/16/2017  . Chronic venous insufficiency 07/31/2014  . Varicose veins of both lower extremities with complications 17/51/0258   Past Medical History:  Diagnosis Date  . Allergic rhinitis    DR. Sherman  . Anal fissure   . Anemia   . Asthma   . Bilateral leg edema   . Chest pain    a. 02/2017: stress  test with ST depression but images showed only a small region of distal inferior ischemia vs. artifact. Overall, the study was low-risk.  Marland Kitchen Hemorrhoids   . Joint pain   . Menorrhagia   . Rectal bleeding   . Thyroid disease   .  Varicose veins    Past Surgical History: Past Surgical History:  Procedure Laterality Date  . ABDOMINAL SURGERY    . CESAREAN SECTION     X2  . KIDNEY SURGERY     kidney/bladder - exploratory   . TONSILLECTOMY     Medication List:  Current Outpatient Medications  Medication Sig Dispense Refill  . Ascorbic Acid (VITAMIN C) 100 MG tablet Take by mouth daily.    . Cholecalciferol (VITAMIN D PO) Take 1 tablet by mouth daily.    . fluticasone (FLONASE) 50 MCG/ACT nasal spray Place 1 spray into both nostrils daily as needed for allergies.     Marland Kitchen NATURE-THROID 32.5 MG tablet Take 32.5 mg by mouth daily.    . Omega 3 1200 MG CAPS Take 2 capsules by mouth daily.    . Turmeric (QC TUMERIC COMPLEX) 500 MG CAPS Take 1 capsule by mouth daily.    Marland Kitchen albuterol (VENTOLIN HFA) 108 (90 Base) MCG/ACT inhaler Inhale 2 puffs into the lungs every 6 (six) hours as needed for wheezing or shortness of breath (coughing fits). 18 g 2  . levalbuterol (XOPENEX HFA) 45 MCG/ACT inhaler Inhale 2 puffs into the lungs every 4 (four) hours as needed for wheezing. (Patient not taking: Reported on 06/07/2020)    . metoprolol tartrate (LOPRESSOR) 25 MG tablet Take 0.5 tablets (12.5 mg total) by mouth 2 (two) times daily. (Patient not taking: Reported on 03/20/2018) 90 tablet 3  . mometasone-formoterol (DULERA) 100-5 MCG/ACT AERO Inhale 2 puffs into the lungs in the morning and at bedtime. Use during upper respiratory infections for 1-2 weeks. 1 each 3  . predniSONE (DELTASONE) 50 MG tablet Take 1 tablet (50 mg total) by mouth daily. (Patient not taking: Reported on 06/07/2020) 5 tablet 0   No current facility-administered medications for this visit.   Allergies: Allergies  Allergen Reactions  .  Ketek [Telithromycin] Other (See Comments)    Dizziness   . Tape   . Erythromycin Rash   Social History: Social History   Socioeconomic History  . Marital status: Married    Spouse name: Not on file  . Number of children: Not on file  . Years of education: Not on file  . Highest education level: Not on file  Occupational History  . Occupation: COMPUTER PROGRAMMER  Tobacco Use  . Smoking status: Never Smoker  . Smokeless tobacco: Never Used  Vaping Use  . Vaping Use: Never used  Substance and Sexual Activity  . Alcohol use: No  . Drug use: No  . Sexual activity: Not on file  Other Topics Concern  . Not on file  Social History Narrative  . Not on file   Social Determinants of Health   Financial Resource Strain:   . Difficulty of Paying Living Expenses: Not on file  Food Insecurity:   . Worried About Charity fundraiser in the Last Year: Not on file  . Ran Out of Food in the Last Year: Not on file  Transportation Needs:   . Lack of Transportation (Medical): Not on file  . Lack of Transportation (Non-Medical): Not on file  Physical Activity:   . Days of Exercise per Week: Not on file  . Minutes of Exercise per Session: Not on file  Stress:   . Feeling of Stress : Not on file  Social Connections:   . Frequency of Communication with Friends and Family: Not on file  . Frequency of Social Gatherings with Friends and Family: Not on  file  . Attends Religious Services: Not on file  . Active Member of Clubs or Organizations: Not on file  . Attends Archivist Meetings: Not on file  . Marital Status: Not on file   Lives in a 54 year old home. Smoking: denies Occupation: AVP analytical development  Environmental History: Water Damage/mildew in the house: no Carpet in the family room: no Carpet in the bedroom: no Heating: heat pump Cooling: heat pump Pet: yes 1 cat indoors, 2 cats total, 2 dogs  Family History: Family History  Problem Relation Age of  Onset  . Heart disease Mother   . Hyperlipidemia Mother   . Hypertension Mother   . Varicose Veins Mother   . Heart disease Father   . Hyperlipidemia Father   . Hypertension Father   . Heart attack Father        69 & 69  . Heart attack Paternal Grandfather 79   Problem                               Relation Asthma                                   Daughter  Eczema                                No  Food allergy                          Daughter  Allergic rhino conjunctivitis     Daughter, son   Review of Systems  Constitutional: Negative for appetite change, chills, fever and unexpected weight change.  HENT: Positive for congestion. Negative for rhinorrhea.   Eyes: Negative for itching.  Respiratory: Negative for cough, chest tightness, shortness of breath and wheezing.   Cardiovascular: Negative for chest pain.  Gastrointestinal: Negative for abdominal pain.  Genitourinary: Negative for difficulty urinating.  Skin: Negative for rash.  Allergic/Immunologic: Positive for environmental allergies.  Neurological: Negative for headaches.   Objective: BP 110/70   Pulse 73   Temp (!) 97.2 F (36.2 C) (Temporal)   Resp 16   Ht 5' 4.5" (1.638 m)   Wt 181 lb 6.4 oz (82.3 kg)   SpO2 97%   BMI 30.66 kg/m  Body mass index is 30.66 kg/m. Physical Exam Vitals and nursing note reviewed.  Constitutional:      Appearance: Normal appearance. She is well-developed.  HENT:     Head: Normocephalic and atraumatic.     Right Ear: External ear normal.     Left Ear: External ear normal.     Nose: Nose normal.     Mouth/Throat:     Mouth: Mucous membranes are moist.     Pharynx: Oropharynx is clear.  Eyes:     Conjunctiva/sclera: Conjunctivae normal.  Cardiovascular:     Rate and Rhythm: Normal rate and regular rhythm.     Heart sounds: Normal heart sounds. No murmur heard.  No friction rub. No gallop.   Pulmonary:     Effort: Pulmonary effort is normal.     Breath sounds: Normal  breath sounds. No wheezing, rhonchi or rales.  Abdominal:     Palpations: Abdomen is soft.  Musculoskeletal:     Cervical back: Neck supple.  Skin:    General: Skin is warm.     Findings: No rash.  Neurological:     Mental Status: She is alert and oriented to person, place, and time.  Psychiatric:        Behavior: Behavior normal.    The plan was reviewed with the patient/family, and all questions/concerned were addressed.  It was my pleasure to see Maura today and participate in her care. Please feel free to contact me with any questions or concerns.  Sincerely,  Rexene Alberts, DO Allergy & Immunology  [facility] office: [phone] [facility] office: [place] office: [phone]

## 2020-06-07 ENCOUNTER — Ambulatory Visit (INDEPENDENT_AMBULATORY_CARE_PROVIDER_SITE_OTHER): Payer: 59 | Admitting: Allergy

## 2020-06-07 ENCOUNTER — Encounter: Payer: Self-pay | Admitting: Allergy

## 2020-06-07 ENCOUNTER — Other Ambulatory Visit: Payer: Self-pay

## 2020-06-07 VITALS — BP 110/70 | HR 73 | Temp 97.2°F | Resp 16 | Ht 64.5 in | Wt 181.4 lb

## 2020-06-07 DIAGNOSIS — J3089 Other allergic rhinitis: Secondary | ICD-10-CM | POA: Diagnosis not present

## 2020-06-07 DIAGNOSIS — J4520 Mild intermittent asthma, uncomplicated: Secondary | ICD-10-CM

## 2020-06-07 MED ORDER — EPINEPHRINE 0.3 MG/0.3ML IJ SOAJ: 0.3000 mg | Freq: Once | INTRAMUSCULAR | 1 refills | Status: AC

## 2020-06-07 MED ORDER — DULERA 100-5 MCG/ACT IN AERO: 2.0000 | INHALATION_SPRAY | Freq: Two times a day (BID) | RESPIRATORY_TRACT | 3 refills | Status: DC

## 2020-06-07 MED ORDER — ALBUTEROL SULFATE HFA 108 (90 BASE) MCG/ACT IN AERS: 2.0000 | INHALATION_SPRAY | Freq: Four times a day (QID) | RESPIRATORY_TRACT | 2 refills | Status: DC | PRN

## 2020-06-07 NOTE — Patient Instructions (Addendum)
Today's skin testing showed: Positive to dust mites, cat and mold.  Environmental allergies  Will review your previous allergist's notes.   Start environmental control measures as below.  May use Flonase (fluticasone) nasal spray 1 spray per nostril twice a day as needed for nasal congestion.   Nasal saline spray (i.e., Simply Saline) or nasal saline lavage (i.e., NeilMed) is recommended as needed and prior to medicated nasal sprays.  Had a detailed discussion with patient/family that clinical history is suggestive of allergic rhinitis, and may benefit from allergy immunotherapy (AIT). Discussed in detail regarding the dosing, schedule, side effects (mild to moderate local allergic reaction and rarely systemic allergic reactions including anaphylaxis), and benefits (significant improvement in nasal symptoms, seasonal flares of asthma) of immunotherapy with the patient. There is significant time commitment involved with allergy shots, which includes weekly immunotherapy injections for first 9-12 months and then biweekly to monthly injections for 3-5 years.   Consent was signed.  Start allergy injections.  Take an allergy medicine on the days of your allergy injections.  May use over the counter antihistamines such as Zyrtec (cetirizine), Claritin (loratadine), Allegra (fexofenadine), or Xyzal (levocetirizine) daily as needed.  I have prescribed epinephrine injectable and demonstrated proper use. For mild symptoms you can take over the counter antihistamines such as Benadryl and monitor symptoms closely. If symptoms worsen or if you have severe symptoms including breathing issues, throat closure, significant swelling, whole body hives, severe diarrhea and vomiting, lightheadedness then inject epinephrine and seek immediate medical care afterwards.  Emergency action plan given.   Breathing:   Today's breathing test was unremarkable.  . Daily controller medication(s): none. . Prior to  physical activity: May use albuterol rescue inhaler 2 puffs 5 to 15 minutes prior to strenuous physical activities. Marland Kitchen Rescue medications: May use albuterol rescue inhaler 2 puffs or nebulizer every 4 to 6 hours as needed for shortness of breath, chest tightness, coughing, and wheezing. Monitor frequency of use.  . During upper respiratory infections/asthma flares: start Dulera 160mg 2 puffs twice a day with spacer and rinse mouth afterwards for 1-2 weeks until your breathing symptoms return to baseline.  . Asthma control goals:  o Full participation in all desired activities (may need albuterol before activity) o Albuterol use two times or less a week on average (not counting use with activity) o Cough interfering with sleep two times or less a month o Oral steroids no more than once a year o No hospitalizations  Follow up in 4 months or sooner if needed.  3 weeks for first allergy injection.  Control of House Dust Mite Allergen . Dust mite allergens are a common trigger of allergy and asthma symptoms. While they can be found throughout the house, these microscopic creatures thrive in warm, humid environments such as bedding, upholstered furniture and carpeting. . Because so much time is spent in the bedroom, it is essential to reduce mite levels there.  . Encase pillows, mattresses, and box springs in special allergen-proof fabric covers or airtight, zippered plastic covers.  . Bedding should be washed weekly in hot water (130 F) and dried in a hot dryer. Allergen-proof covers are available for comforters and pillows that can't be regularly washed.  .Wendee Coppthe allergy-proof covers every few months. Minimize clutter in the bedroom. Keep pets out of the bedroom.  .Marland KitchenKeep humidity less than 50% by using a dehumidifier or air conditioning. You can buy a humidity measuring device called a hygrometer to monitor this.  .Marland Kitchen  If possible, replace carpets with hardwood, linoleum, or washable area rugs. If  that's not possible, vacuum frequently with a vacuum that has a HEPA filter. . Remove all upholstered furniture and non-washable window drapes from the bedroom. . Remove all non-washable stuffed toys from the bedroom.  Wash stuffed toys weekly. Pet Allergen Avoidance: . Contrary to popular opinion, there are no "hypoallergenic" breeds of dogs or cats. That is because people are not allergic to an animal's hair, but to an allergen found in the animal's saliva, dander (dead skin flakes) or urine. Pet allergy symptoms typically occur within minutes. For some people, symptoms can build up and become most severe 8 to 12 hours after contact with the animal. People with severe allergies can experience reactions in public places if dander has been transported on the pet owners' clothing. Marland Kitchen Keeping an animal outdoors is only a partial solution, since homes with pets in the yard still have higher concentrations of animal allergens. . Before getting a pet, ask your allergist to determine if you are allergic to animals. If your pet is already considered part of your family, try to minimize contact and keep the pet out of the bedroom and other rooms where you spend a great deal of time. . As with dust mites, vacuum carpets often or replace carpet with a hardwood floor, tile or linoleum. . High-efficiency particulate air (HEPA) cleaners can reduce allergen levels over time. . While dander and saliva are the source of cat and dog allergens, urine is the source of allergens from rabbits, hamsters, mice and [place] pigs; so ask a non-allergic family member to clean the animal's cage. . If you have a pet allergy, talk to your allergist about the potential for allergy immunotherapy (allergy shots). This strategy can often provide long-term relief. Mold Control . Mold and fungi can grow on a variety of surfaces provided certain temperature and moisture conditions exist.  . Outdoor molds grow on plants, decaying vegetation  and soil. The major outdoor mold, Alternaria and Cladosporium, are found in very high numbers during hot and dry conditions. Generally, a late summer - fall peak is seen for common outdoor fungal spores. Rain will temporarily lower outdoor mold spore count, but counts rise rapidly when the rainy period ends. . The most important indoor molds are Aspergillus and Penicillium. Dark, humid and poorly ventilated basements are ideal sites for mold growth. The next most common sites of mold growth are the bathroom and the kitchen. Outdoor (Seasonal) Mold Control . Use air conditioning and keep windows closed. . Avoid exposure to decaying vegetation. Marland Kitchen Avoid leaf raking. . Avoid grain handling. . Consider wearing a face mask if working in moldy areas.  Indoor (Perennial) Mold Control  . Maintain humidity below 50%. . Get rid of mold growth on hard surfaces with water, detergent and, if necessary, 5% bleach (do not mix with other cleaners). Then dry the area completely. If mold covers an area more than 10 square feet, consider hiring an indoor environmental professional. . For clothing, washing with soap and water is best. If moldy items cannot be cleaned and dried, throw them away. . Remove sources e.g. contaminated carpets. . Repair and seal leaking roofs or pipes. Using dehumidifiers in damp basements may be helpful, but empty the water and clean units regularly to prevent mildew from forming. All rooms, especially basements, bathrooms and kitchens, require ventilation and cleaning to deter mold and mildew growth. Avoid carpeting on concrete or damp floors, and storing items  in damp areas.

## 2020-06-08 ENCOUNTER — Encounter: Payer: Self-pay | Admitting: Allergy

## 2020-06-08 NOTE — Assessment & Plan Note (Signed)
Exercise and URIs tend to trigger her asthma. Uses albuterol as needed with good benefit. In January 2021 had to use nebulizer due to her symptoms. Advair Diskus caused coughing in the past.   Today's spirometry was unremarkable.  . Daily controller medication(s): none. . Prior to physical activity: May use albuterol rescue inhaler 2 puffs 5 to 15 minutes prior to strenuous physical activities. Marland Kitchen Rescue medications: May use albuterol rescue inhaler 2 puffs or nebulizer every 4 to 6 hours as needed for shortness of breath, chest tightness, coughing, and wheezing. Monitor frequency of use.  . During upper respiratory infections/asthma flares: start Dulera 168mg 2 puffs twice a day with spacer and rinse mouth afterwards for 1-2 weeks until your breathing symptoms return to baseline.

## 2020-06-08 NOTE — Assessment & Plan Note (Signed)
Perennial rhinitis symptoms for 10+ years. Was on allergy immunotherapy in the past with good benefit and interested in re-starting. Last skin testing was over 3 years ago - in process of obtaining records. No recent ENT evaluation.  Today's skin testing showed: Positive to dust mites, cat and mold.  Awaiting previous allergist's notes.   Start environmental control measures as below.  May use Flonase (fluticasone) nasal spray 1 spray per nostril twice a day as needed for nasal congestion.   Nasal saline spray (i.e., Simply Saline) or nasal saline lavage (i.e., NeilMed) is recommended as needed and prior to medicated nasal sprays.  Had a detailed discussion with patient/family that clinical history is suggestive of allergic rhinitis, and may benefit from allergy immunotherapy (AIT). Discussed in detail regarding the dosing, schedule, side effects (mild to moderate local allergic reaction and rarely systemic allergic reactions including anaphylaxis), and benefits (significant improvement in nasal symptoms, seasonal flares of asthma) of immunotherapy with the patient. There is significant time commitment involved with allergy shots, which includes weekly immunotherapy injections for first 9-12 months and then biweekly to monthly injections for 3-5 years.   Consent was signed.  Start allergy injections.  Take an antihistamine medicine on the days of allergy injections.  I have prescribed epinephrine injectable and demonstrated proper use. For mild symptoms you can take over the counter antihistamines such as Benadryl and monitor symptoms closely. If symptoms worsen or if you have severe symptoms including breathing issues, throat closure, significant swelling, whole body hives, severe diarrhea and vomiting, lightheadedness then inject epinephrine and seek immediate medical care afterwards.  Emergency action plan given.

## 2020-06-08 NOTE — Progress Notes (Signed)
VIALS EXP 06-09-21

## 2020-06-09 DIAGNOSIS — J3081 Allergic rhinitis due to animal (cat) (dog) hair and dander: Secondary | ICD-10-CM

## 2020-06-10 ENCOUNTER — Encounter: Payer: Self-pay | Admitting: Allergy

## 2020-06-10 NOTE — Progress Notes (Signed)
Received records from [facility] - see scanned notes. 03/25/2019 skin testing positive to dust mites. Intradermal slightly positive to rhizopus and penicillium mix.

## 2020-07-01 ENCOUNTER — Ambulatory Visit: Payer: 59

## 2020-07-06 ENCOUNTER — Other Ambulatory Visit: Payer: Self-pay

## 2020-07-06 ENCOUNTER — Ambulatory Visit (INDEPENDENT_AMBULATORY_CARE_PROVIDER_SITE_OTHER): Payer: 59 | Admitting: *Deleted

## 2020-07-06 DIAGNOSIS — J309 Allergic rhinitis, unspecified: Secondary | ICD-10-CM | POA: Diagnosis not present

## 2020-07-06 NOTE — Progress Notes (Signed)
Immunotherapy   Patient Details  Name: Donna Armstrong MRN: [id number] Date of Birth: 1966-04-05  07/06/2020  Donna Armstrong started injections for  M-DM-C Following schedule: B  Frequency:1 time per week Epi-Pen:Epi-Pen Available  Consent signed and patient instructions given. Patient started allergy injections today and received 0.21m of M-DM-C int he R[facility]. Patient waited 30 minutes in clinic and did not experience any issues.   Seab Axel Fernandez-Vernon 07/06/2020, 6:12 PM

## 2020-07-20 ENCOUNTER — Ambulatory Visit (INDEPENDENT_AMBULATORY_CARE_PROVIDER_SITE_OTHER): Payer: 59 | Admitting: *Deleted

## 2020-07-20 DIAGNOSIS — J309 Allergic rhinitis, unspecified: Secondary | ICD-10-CM | POA: Diagnosis not present

## 2020-07-29 ENCOUNTER — Ambulatory Visit (INDEPENDENT_AMBULATORY_CARE_PROVIDER_SITE_OTHER): Payer: 59

## 2020-07-29 DIAGNOSIS — J309 Allergic rhinitis, unspecified: Secondary | ICD-10-CM | POA: Diagnosis not present

## 2020-08-04 ENCOUNTER — Ambulatory Visit (INDEPENDENT_AMBULATORY_CARE_PROVIDER_SITE_OTHER): Payer: 59 | Admitting: *Deleted

## 2020-08-04 DIAGNOSIS — J309 Allergic rhinitis, unspecified: Secondary | ICD-10-CM | POA: Diagnosis not present

## 2020-08-12 ENCOUNTER — Ambulatory Visit (INDEPENDENT_AMBULATORY_CARE_PROVIDER_SITE_OTHER): Payer: 59

## 2020-08-12 DIAGNOSIS — J309 Allergic rhinitis, unspecified: Secondary | ICD-10-CM | POA: Diagnosis not present

## 2020-08-17 ENCOUNTER — Ambulatory Visit (INDEPENDENT_AMBULATORY_CARE_PROVIDER_SITE_OTHER): Payer: 59

## 2020-08-17 DIAGNOSIS — J309 Allergic rhinitis, unspecified: Secondary | ICD-10-CM

## 2020-08-24 ENCOUNTER — Ambulatory Visit (INDEPENDENT_AMBULATORY_CARE_PROVIDER_SITE_OTHER): Payer: 59 | Admitting: *Deleted

## 2020-08-24 DIAGNOSIS — J309 Allergic rhinitis, unspecified: Secondary | ICD-10-CM

## 2020-08-31 ENCOUNTER — Ambulatory Visit (INDEPENDENT_AMBULATORY_CARE_PROVIDER_SITE_OTHER): Payer: 59

## 2020-08-31 DIAGNOSIS — J309 Allergic rhinitis, unspecified: Secondary | ICD-10-CM | POA: Diagnosis not present

## 2020-09-16 ENCOUNTER — Ambulatory Visit (INDEPENDENT_AMBULATORY_CARE_PROVIDER_SITE_OTHER): Payer: 59

## 2020-09-16 DIAGNOSIS — J309 Allergic rhinitis, unspecified: Secondary | ICD-10-CM | POA: Diagnosis not present

## 2020-09-24 ENCOUNTER — Ambulatory Visit (INDEPENDENT_AMBULATORY_CARE_PROVIDER_SITE_OTHER): Payer: 59 | Admitting: *Deleted

## 2020-09-24 DIAGNOSIS — J309 Allergic rhinitis, unspecified: Secondary | ICD-10-CM | POA: Diagnosis not present

## 2020-09-26 NOTE — Progress Notes (Signed)
Follow Up Note  RE: Donna Armstrong MRN: [id number] DOB: 1966/01/09 Date of Office Visit: 09/27/2020  Referring provider: Lujean Amel, MD Primary care provider: Lujean Amel, MD  Chief Complaint: Follow-up and Asthma  History of Present Illness: I had the pleasure of seeing Donna Armstrong for a follow up visit at the [facility] on 09/27/2020. She is a 54 y.o. female, who is being followed for allergic rhinitis on AIT and reactive airway disease. Her previous allergy office visit was on 06/07/2020 with Dr. Maudie Mercury. Today is a regular follow up visit.  Allergic rhinitis Started allergy injections with no issues.  Patient got Moderna vaccines in March and April. Patient needs to get flu, Moderna booster, shingles vaccine and wondering in what order to get this done.   Only taking Flonase 1 spray per nostril twice a day. Some nosebleeds with the dray air.   Not taking any daily antihistamines.   Mild intermittent reactive airway disease Denies any SOB, coughing, wheezing, chest tightness, nocturnal awakenings, ER/urgent care visits or prednisone use since the last visit. No recent URIs.  Assessment and Plan: Blakely is a 54 y.o. female with: Mild intermittent reactive airway disease Past history - Exercise and URIs tend to trigger her asthma. Uses albuterol as needed with good benefit. In January 2021 had to use nebulizer due to her symptoms. Advair Diskus caused coughing in the past. 2021 spirometry was unremarkable.  Interim history - asymptomatic with no inhaler use.  . Daily controller medication(s): none. . Prior to physical activity: May use albuterol rescue inhaler 2 puffs 5 to 15 minutes prior to strenuous physical activities. Marland Kitchen Rescue medications: May use albuterol rescue inhaler 2 puffs or nebulizer every 4 to 6 hours as needed for shortness of breath, chest tightness, coughing, and wheezing. Monitor frequency of use.  . During upper respiratory  infections/asthma flares: start Dulera 180mg 2 puffs twice a day with spacer and rinse mouth afterwards for 1-2 weeks until your breathing symptoms return to baseline.  .Marland KitchenSpacer given and demonstrated proper use with inhaler. Patient understood technique and all questions/concerned were addressed.   Other allergic rhinitis Past history - Perennial rhinitis symptoms for 10+ years. Was on allergy immunotherapy in the past with good benefit and interested in re-starting. Last skin testing was over 3 years ago. No recent ENT evaluation. 2021 skin testing showed: Positive to dust mites, cat and mold. Previous allergist records - reviewed and scanned. Interim history - started AIT on 07/06/2020 (M-DM-C) and tolerating it well.   Continue environmental control measures as below.  May use Flonase (fluticasone) nasal spray 1 spray per nostril twice a day as needed for nasal congestion.   Nasal saline spray (i.e., Simply Saline) or nasal saline lavage (i.e., NeilMed) is recommended as needed and prior to medicated nasal sprays.  Continue allergy injections - shot given today.   Take an allergy medicine on the days of your allergy injections if you notice localized reactions.   May use over the counter antihistamines such as Zyrtec (cetirizine), Claritin (loratadine), Allegra (fexofenadine), or Xyzal (levocetirizine) daily as needed. .Hanley Seameninformation on nosebleeds - try saline gel.   Return in about 6 months (around 03/28/2021).  Recommend getting flu/Moderna booster first then shingles vaccine.   Diagnostics: None.  Medication List:  Current Outpatient Medications  Medication Sig Dispense Refill  . albuterol (VENTOLIN HFA) 108 (90 Base) MCG/ACT inhaler Inhale 2 puffs into the lungs every 6 (six) hours as needed for  wheezing or shortness of breath (coughing fits). 18 g 2  . Ascorbic Acid (VITAMIN C) 100 MG tablet Take by mouth daily.    . Cholecalciferol (VITAMIN D PO) Take 1 tablet by mouth  daily.    . fluticasone (FLONASE) 50 MCG/ACT nasal spray Place 1 spray into both nostrils daily as needed for allergies.     Marland Kitchen levalbuterol (XOPENEX HFA) 45 MCG/ACT inhaler Inhale 2 puffs into the lungs every 4 (four) hours as needed for wheezing.    . mometasone-formoterol (DULERA) 100-5 MCG/ACT AERO Inhale 2 puffs into the lungs in the morning and at bedtime. Use during upper respiratory infections for 1-2 weeks. 1 each 3  . NATURE-THROID 32.5 MG tablet Take 32.5 mg by mouth daily.    . Omega 3 1200 MG CAPS Take 2 capsules by mouth daily.    . Turmeric 500 MG CAPS Take 1 capsule by mouth daily.    . metoprolol tartrate (LOPRESSOR) 25 MG tablet Take 0.5 tablets (12.5 mg total) by mouth 2 (two) times daily. (Patient not taking: No sig reported) 90 tablet 3  . predniSONE (DELTASONE) 50 MG tablet Take 1 tablet (50 mg total) by mouth daily. (Patient not taking: No sig reported) 5 tablet 0   No current facility-administered medications for this visit.   Allergies: Allergies  Allergen Reactions  . Ketek [Telithromycin] Other (See Comments)    Dizziness   . Tape   . Erythromycin Rash   I reviewed her past medical history, social history, family history, and environmental history and no significant changes have been reported from her previous visit.  Review of Systems  Constitutional: Negative for appetite change, chills, fever and unexpected weight change.  HENT: Positive for congestion. Negative for rhinorrhea.   Eyes: Negative for itching.  Respiratory: Negative for cough, chest tightness, shortness of breath and wheezing.   Cardiovascular: Negative for chest pain.  Gastrointestinal: Negative for abdominal pain.  Genitourinary: Negative for difficulty urinating.  Skin: Negative for rash.  Allergic/Immunologic: Positive for environmental allergies.  Neurological: Negative for headaches.   Objective: BP 110/78 (BP Location: Right Arm, Patient Position: Sitting, Cuff Size: Normal)    Pulse 79   Temp (!) 97.3 F (36.3 C) (Temporal)   Resp 18   Ht 5' 4"  (1.626 m)   Wt 180 lb 3.2 oz (81.7 kg)   SpO2 95%   BMI 30.93 kg/m  Body mass index is 30.93 kg/m. Physical Exam Vitals and nursing note reviewed.  Constitutional:      Appearance: Normal appearance. She is well-developed.  HENT:     Head: Normocephalic and atraumatic.     Right Ear: External ear normal.     Left Ear: External ear normal.     Nose: Nose normal.     Mouth/Throat:     Mouth: Mucous membranes are moist.     Pharynx: Oropharynx is clear.  Eyes:     Conjunctiva/sclera: Conjunctivae normal.  Cardiovascular:     Rate and Rhythm: Normal rate and regular rhythm.     Heart sounds: Normal heart sounds. No murmur heard. No friction rub. No gallop.   Pulmonary:     Effort: Pulmonary effort is normal.     Breath sounds: Normal breath sounds. No wheezing, rhonchi or rales.  Abdominal:     Palpations: Abdomen is soft.  Musculoskeletal:     Cervical back: Neck supple.  Skin:    General: Skin is warm.     Findings: No rash.  Neurological:  Mental Status: She is alert and oriented to person, place, and time.  Psychiatric:        Behavior: Behavior normal.    Previous notes and tests were reviewed. The plan was reviewed with the patient/family, and all questions/concerned were addressed.  It was my pleasure to see Donna Armstrong today and participate in her care. Please feel free to contact me with any questions or concerns.  Sincerely,  Rexene Alberts, DO Allergy & Immunology  [facility] office: [place] office: [phone]

## 2020-09-27 ENCOUNTER — Other Ambulatory Visit: Payer: Self-pay

## 2020-09-27 ENCOUNTER — Encounter: Payer: Self-pay | Admitting: Allergy

## 2020-09-27 ENCOUNTER — Ambulatory Visit (INDEPENDENT_AMBULATORY_CARE_PROVIDER_SITE_OTHER): Payer: 59 | Admitting: Allergy

## 2020-09-27 VITALS — BP 110/78 | HR 79 | Temp 97.3°F | Resp 18 | Ht 64.0 in | Wt 180.2 lb

## 2020-09-27 DIAGNOSIS — J4520 Mild intermittent asthma, uncomplicated: Secondary | ICD-10-CM | POA: Diagnosis not present

## 2020-09-27 DIAGNOSIS — J3089 Other allergic rhinitis: Secondary | ICD-10-CM

## 2020-09-27 NOTE — Assessment & Plan Note (Addendum)
Past history - Perennial rhinitis symptoms for 10+ years. Was on allergy immunotherapy in the past with good benefit and interested in re-starting. Last skin testing was over 3 years ago. No recent ENT evaluation. 2021 skin testing showed: Positive to dust mites, cat and mold. Previous allergist records - reviewed and scanned. Interim history - started AIT on 07/06/2020 (M-DM-C) and tolerating it well.   Continue environmental control measures as below.  May use Flonase (fluticasone) nasal spray 1 spray per nostril twice a day as needed for nasal congestion.   Nasal saline spray (i.e., Simply Saline) or nasal saline lavage (i.e., NeilMed) is recommended as needed and prior to medicated nasal sprays.  Continue allergy injections - shot given today.   Take an allergy medicine on the days of your allergy injections if you notice localized reactions.   May use over the counter antihistamines such as Zyrtec (cetirizine), Claritin (loratadine), Allegra (fexofenadine), or Xyzal (levocetirizine) daily as needed. Hanley Seamen information on nosebleeds - try saline gel.

## 2020-09-27 NOTE — Patient Instructions (Addendum)
Environmental allergies  Past skin testing showed: Positive to dust mites, cat and mold.  Continue environmental control measures as below.  May use Flonase (fluticasone) nasal spray 1 spray per nostril twice a day as needed for nasal congestion.   Nasal saline spray (i.e., Simply Saline) or nasal saline lavage (i.e., NeilMed) is recommended as needed and prior to medicated nasal sprays.  Continue allergy injections - shot given today.   Take an allergy medicine on the days of your allergy injections if you notice localized reactions.   May use over the counter antihistamines such as Zyrtec (cetirizine), Claritin (loratadine), Allegra (fexofenadine), or Xyzal (levocetirizine) daily as needed.  Nose Bleeds: . Nosebleeds are very common.  Site of the bleeding is typically on the septum or at the very front of the nose.  Some of the more common causes are from trauma, inflammation or medication induced. Marland Kitchen Pinch both nostrils while leaning forward for at least 5 minutes before checking to see if the bleeding has stopped. If bleeding is not controlled within 5-10 minutes apply a cotton ball soaked with oxymetazoline (Afrin) to the bleeding nostril for a few seconds.  Preventative treatment: . Apply saline nasal gel in each nostril twice a day for 2 weeks to allow the nasal mucosa to heal . Consider using a humidifier in the winter . Try to keep your blood pressure as normal as possible (120/80)  Breathing:  . Daily controller medication(s): none. . Prior to physical activity: May use albuterol rescue inhaler 2 puffs 5 to 15 minutes prior to strenuous physical activities. Marland Kitchen Rescue medications: May use albuterol rescue inhaler 2 puffs or nebulizer every 4 to 6 hours as needed for shortness of breath, chest tightness, coughing, and wheezing. Monitor frequency of use.  . During upper respiratory infections/asthma flares: start Dulera 17mg 2 puffs twice a day with spacer and rinse mouth  afterwards for 1-2 weeks until your breathing symptoms return to baseline.  . Asthma control goals:  o Full participation in all desired activities (may need albuterol before activity) o Albuterol use two times or less a week on average (not counting use with activity) o Cough interfering with sleep two times or less a month o Oral steroids no more than once a year o No hospitalizations  Follow up in 6 months or sooner if needed.   Recommend getting flu vaccine, covid-19 booster.   Control of House Dust Mite Allergen . Dust mite allergens are a common trigger of allergy and asthma symptoms. While they can be found throughout the house, these microscopic creatures thrive in warm, humid environments such as bedding, upholstered furniture and carpeting. . Because so much time is spent in the bedroom, it is essential to reduce mite levels there.  . Encase pillows, mattresses, and box springs in special allergen-proof fabric covers or airtight, zippered plastic covers.  . Bedding should be washed weekly in hot water (130 F) and dried in a hot dryer. Allergen-proof covers are available for comforters and pillows that can't be regularly washed.  .Wendee Coppthe allergy-proof covers every few months. Minimize clutter in the bedroom. Keep pets out of the bedroom.  .Marland KitchenKeep humidity less than 50% by using a dehumidifier or air conditioning. You can buy a humidity measuring device called a hygrometer to monitor this.  . If possible, replace carpets with hardwood, linoleum, or washable area rugs. If that's not possible, vacuum frequently with a vacuum that has a HEPA filter. . Remove all upholstered furniture and  non-washable window drapes from the bedroom. . Remove all non-washable stuffed toys from the bedroom.  Wash stuffed toys weekly. Pet Allergen Avoidance: . Contrary to popular opinion, there are no "hypoallergenic" breeds of dogs or cats. That is because people are not allergic to an animal's hair, but  to an allergen found in the animal's saliva, dander (dead skin flakes) or urine. Pet allergy symptoms typically occur within minutes. For some people, symptoms can build up and become most severe 8 to 12 hours after contact with the animal. People with severe allergies can experience reactions in public places if dander has been transported on the pet owners' clothing. Marland Kitchen Keeping an animal outdoors is only a partial solution, since homes with pets in the yard still have higher concentrations of animal allergens. . Before getting a pet, ask your allergist to determine if you are allergic to animals. If your pet is already considered part of your family, try to minimize contact and keep the pet out of the bedroom and other rooms where you spend a great deal of time. . As with dust mites, vacuum carpets often or replace carpet with a hardwood floor, tile or linoleum. . High-efficiency particulate air (HEPA) cleaners can reduce allergen levels over time. . While dander and saliva are the source of cat and dog allergens, urine is the source of allergens from rabbits, hamsters, mice and [place] pigs; so ask a non-allergic family member to clean the animal's cage. . If you have a pet allergy, talk to your allergist about the potential for allergy immunotherapy (allergy shots). This strategy can often provide long-term relief. Mold Control . Mold and fungi can grow on a variety of surfaces provided certain temperature and moisture conditions exist.  . Outdoor molds grow on plants, decaying vegetation and soil. The major outdoor mold, Alternaria and Cladosporium, are found in very high numbers during hot and dry conditions. Generally, a late summer - fall peak is seen for common outdoor fungal spores. Rain will temporarily lower outdoor mold spore count, but counts rise rapidly when the rainy period ends. . The most important indoor molds are Aspergillus and Penicillium. Dark, humid and poorly ventilated basements  are ideal sites for mold growth. The next most common sites of mold growth are the bathroom and the kitchen. Outdoor (Seasonal) Mold Control . Use air conditioning and keep windows closed. . Avoid exposure to decaying vegetation. Marland Kitchen Avoid leaf raking. . Avoid grain handling. . Consider wearing a face mask if working in moldy areas.  Indoor (Perennial) Mold Control  . Maintain humidity below 50%. . Get rid of mold growth on hard surfaces with water, detergent and, if necessary, 5% bleach (do not mix with other cleaners). Then dry the area completely. If mold covers an area more than 10 square feet, consider hiring an indoor environmental professional. . For clothing, washing with soap and water is best. If moldy items cannot be cleaned and dried, throw them away. . Remove sources e.g. contaminated carpets. . Repair and seal leaking roofs or pipes. Using dehumidifiers in damp basements may be helpful, but empty the water and clean units regularly to prevent mildew from forming. All rooms, especially basements, bathrooms and kitchens, require ventilation and cleaning to deter mold and mildew growth. Avoid carpeting on concrete or damp floors, and storing items in damp areas.

## 2020-09-27 NOTE — Assessment & Plan Note (Signed)
Past history - Exercise and URIs tend to trigger her asthma. Uses albuterol as needed with good benefit. In January 2021 had to use nebulizer due to her symptoms. Advair Diskus caused coughing in the past. 2021 spirometry was unremarkable.  Interim history - asymptomatic with no inhaler use.  . Daily controller medication(s): none. . Prior to physical activity: May use albuterol rescue inhaler 2 puffs 5 to 15 minutes prior to strenuous physical activities. Marland Kitchen Rescue medications: May use albuterol rescue inhaler 2 puffs or nebulizer every 4 to 6 hours as needed for shortness of breath, chest tightness, coughing, and wheezing. Monitor frequency of use.  . During upper respiratory infections/asthma flares: start Dulera 134mg 2 puffs twice a day with spacer and rinse mouth afterwards for 1-2 weeks until your breathing symptoms return to baseline.  .Marland KitchenSpacer given and demonstrated proper use with inhaler. Patient understood technique and all questions/concerned were addressed.

## 2020-10-05 ENCOUNTER — Ambulatory Visit (INDEPENDENT_AMBULATORY_CARE_PROVIDER_SITE_OTHER): Payer: 59 | Admitting: *Deleted

## 2020-10-05 DIAGNOSIS — J309 Allergic rhinitis, unspecified: Secondary | ICD-10-CM | POA: Diagnosis not present

## 2020-10-19 ENCOUNTER — Ambulatory Visit (INDEPENDENT_AMBULATORY_CARE_PROVIDER_SITE_OTHER): Payer: 59

## 2020-10-19 DIAGNOSIS — J309 Allergic rhinitis, unspecified: Secondary | ICD-10-CM

## 2020-10-28 ENCOUNTER — Ambulatory Visit (INDEPENDENT_AMBULATORY_CARE_PROVIDER_SITE_OTHER): Payer: 59 | Admitting: *Deleted

## 2020-10-28 DIAGNOSIS — J309 Allergic rhinitis, unspecified: Secondary | ICD-10-CM

## 2020-11-09 ENCOUNTER — Ambulatory Visit (INDEPENDENT_AMBULATORY_CARE_PROVIDER_SITE_OTHER): Payer: 59

## 2020-11-09 DIAGNOSIS — J309 Allergic rhinitis, unspecified: Secondary | ICD-10-CM | POA: Diagnosis not present

## 2020-11-18 ENCOUNTER — Ambulatory Visit (INDEPENDENT_AMBULATORY_CARE_PROVIDER_SITE_OTHER): Payer: 59

## 2020-11-18 DIAGNOSIS — J309 Allergic rhinitis, unspecified: Secondary | ICD-10-CM

## 2020-11-25 ENCOUNTER — Ambulatory Visit (INDEPENDENT_AMBULATORY_CARE_PROVIDER_SITE_OTHER): Payer: 59

## 2020-11-25 DIAGNOSIS — J309 Allergic rhinitis, unspecified: Secondary | ICD-10-CM | POA: Diagnosis not present

## 2020-12-01 ENCOUNTER — Ambulatory Visit (INDEPENDENT_AMBULATORY_CARE_PROVIDER_SITE_OTHER): Payer: 59 | Admitting: *Deleted

## 2020-12-01 DIAGNOSIS — J309 Allergic rhinitis, unspecified: Secondary | ICD-10-CM

## 2020-12-09 ENCOUNTER — Ambulatory Visit (INDEPENDENT_AMBULATORY_CARE_PROVIDER_SITE_OTHER): Payer: 59

## 2020-12-09 DIAGNOSIS — J309 Allergic rhinitis, unspecified: Secondary | ICD-10-CM | POA: Diagnosis not present

## 2020-12-17 ENCOUNTER — Ambulatory Visit (INDEPENDENT_AMBULATORY_CARE_PROVIDER_SITE_OTHER): Payer: 59 | Admitting: *Deleted

## 2020-12-17 DIAGNOSIS — J309 Allergic rhinitis, unspecified: Secondary | ICD-10-CM | POA: Diagnosis not present

## 2020-12-21 ENCOUNTER — Ambulatory Visit (INDEPENDENT_AMBULATORY_CARE_PROVIDER_SITE_OTHER): Payer: 59

## 2020-12-21 DIAGNOSIS — J309 Allergic rhinitis, unspecified: Secondary | ICD-10-CM

## 2020-12-31 ENCOUNTER — Ambulatory Visit (INDEPENDENT_AMBULATORY_CARE_PROVIDER_SITE_OTHER): Payer: 59 | Admitting: *Deleted

## 2020-12-31 DIAGNOSIS — J309 Allergic rhinitis, unspecified: Secondary | ICD-10-CM

## 2021-01-12 ENCOUNTER — Ambulatory Visit (INDEPENDENT_AMBULATORY_CARE_PROVIDER_SITE_OTHER): Payer: 59 | Admitting: *Deleted

## 2021-01-12 DIAGNOSIS — J309 Allergic rhinitis, unspecified: Secondary | ICD-10-CM

## 2021-02-01 ENCOUNTER — Ambulatory Visit (INDEPENDENT_AMBULATORY_CARE_PROVIDER_SITE_OTHER): Payer: 59 | Admitting: *Deleted

## 2021-02-01 DIAGNOSIS — J309 Allergic rhinitis, unspecified: Secondary | ICD-10-CM

## 2021-02-16 ENCOUNTER — Ambulatory Visit (INDEPENDENT_AMBULATORY_CARE_PROVIDER_SITE_OTHER): Payer: 59

## 2021-02-16 DIAGNOSIS — J309 Allergic rhinitis, unspecified: Secondary | ICD-10-CM

## 2021-02-23 ENCOUNTER — Ambulatory Visit (INDEPENDENT_AMBULATORY_CARE_PROVIDER_SITE_OTHER): Payer: 59

## 2021-02-23 DIAGNOSIS — J309 Allergic rhinitis, unspecified: Secondary | ICD-10-CM | POA: Diagnosis not present

## 2021-03-09 ENCOUNTER — Ambulatory Visit (INDEPENDENT_AMBULATORY_CARE_PROVIDER_SITE_OTHER): Payer: 59

## 2021-03-09 DIAGNOSIS — J309 Allergic rhinitis, unspecified: Secondary | ICD-10-CM

## 2021-03-24 ENCOUNTER — Ambulatory Visit (INDEPENDENT_AMBULATORY_CARE_PROVIDER_SITE_OTHER): Payer: 59 | Admitting: *Deleted

## 2021-03-24 DIAGNOSIS — J309 Allergic rhinitis, unspecified: Secondary | ICD-10-CM

## 2021-03-30 ENCOUNTER — Ambulatory Visit (INDEPENDENT_AMBULATORY_CARE_PROVIDER_SITE_OTHER): Payer: 59

## 2021-03-30 DIAGNOSIS — J309 Allergic rhinitis, unspecified: Secondary | ICD-10-CM

## 2021-04-04 ENCOUNTER — Encounter: Payer: Self-pay | Admitting: Allergy

## 2021-04-04 ENCOUNTER — Other Ambulatory Visit: Payer: Self-pay

## 2021-04-04 ENCOUNTER — Ambulatory Visit (INDEPENDENT_AMBULATORY_CARE_PROVIDER_SITE_OTHER): Payer: 59 | Admitting: Allergy

## 2021-04-04 VITALS — BP 112/70 | HR 71 | Temp 97.9°F | Resp 18 | Ht 64.0 in | Wt 188.2 lb

## 2021-04-04 DIAGNOSIS — J309 Allergic rhinitis, unspecified: Secondary | ICD-10-CM | POA: Diagnosis not present

## 2021-04-04 DIAGNOSIS — J4520 Mild intermittent asthma, uncomplicated: Secondary | ICD-10-CM

## 2021-04-04 DIAGNOSIS — J3089 Other allergic rhinitis: Secondary | ICD-10-CM

## 2021-04-04 MED ORDER — ALBUTEROL SULFATE HFA 108 (90 BASE) MCG/ACT IN AERS: 2.0000 | INHALATION_SPRAY | RESPIRATORY_TRACT | 2 refills | Status: DC | PRN

## 2021-04-04 NOTE — Patient Instructions (Addendum)
Environmental allergies Past skin testing showed: Positive to dust mites, cat and mold. Continue environmental control measures as below. May use Flonase (fluticasone) nasal spray 1 spray per nostril twice a day as needed for nasal congestion.  Nasal saline spray (i.e., Simply Saline) or nasal saline lavage (i.e., NeilMed) is recommended as needed and prior to medicated nasal sprays. Continue allergy injections - shot given today.  You may take your allergy medication twice a day the day before and the day of injection.  May use over the counter antihistamines such as Zyrtec (cetirizine), Claritin (loratadine), Allegra (fexofenadine), or Xyzal (levocetirizine) daily as needed.  Breathing:  Daily controller medication(s): none. Prior to physical activity: May use albuterol rescue inhaler 2 puffs 5 to 15 minutes prior to strenuous physical activities. Rescue medications: May use albuterol rescue inhaler 2 puffs or nebulizer every 4 to 6 hours as needed for shortness of breath, chest tightness, coughing, and wheezing. Monitor frequency of use.  During upper respiratory infections/asthma flares: start Dulera 137mg 2 puffs twice a day with spacer and rinse mouth afterwards for 1-2 weeks until your breathing symptoms return to baseline.  Asthma control goals:  Full participation in all desired activities (may need albuterol before activity) Albuterol use two times or less a week on average (not counting use with activity) Cough interfering with sleep two times or less a month Oral steroids no more than once a year No hospitalizations  Follow up in 6 months or sooner if needed.   Recommend getting flu vaccine, covid-19 booster and shingles vaccine.   Control of House Dust Mite Allergen Dust mite allergens are a common trigger of allergy and asthma symptoms. While they can be found throughout the house, these microscopic creatures thrive in warm, humid environments such as bedding, upholstered  furniture and carpeting. Because so much time is spent in the bedroom, it is essential to reduce mite levels there.  Encase pillows, mattresses, and box springs in special allergen-proof fabric covers or airtight, zippered plastic covers.  Bedding should be washed weekly in hot water (130 F) and dried in a hot dryer. Allergen-proof covers are available for comforters and pillows that can't be regularly washed.  Wash the allergy-proof covers every few months. Minimize clutter in the bedroom. Keep pets out of the bedroom.  Keep humidity less than 50% by using a dehumidifier or air conditioning. You can buy a humidity measuring device called a hygrometer to monitor this.  If possible, replace carpets with hardwood, linoleum, or washable area rugs. If that's not possible, vacuum frequently with a vacuum that has a HEPA filter. Remove all upholstered furniture and non-washable window drapes from the bedroom. Remove all non-washable stuffed toys from the bedroom.  Wash stuffed toys weekly. Pet Allergen Avoidance: Contrary to popular opinion, there are no "hypoallergenic" breeds of dogs or cats. That is because people are not allergic to an animal's hair, but to an allergen found in the animal's saliva, dander (dead skin flakes) or urine. Pet allergy symptoms typically occur within minutes. For some people, symptoms can build up and become most severe 8 to 12 hours after contact with the animal. People with severe allergies can experience reactions in public places if dander has been transported on the pet owners' clothing. Keeping an animal outdoors is only a partial solution, since homes with pets in the yard still have higher concentrations of animal allergens. Before getting a pet, ask your allergist to determine if you are allergic to animals. If your pet is  already considered part of your family, try to minimize contact and keep the pet out of the bedroom and other rooms where you spend a great deal of  time. As with dust mites, vacuum carpets often or replace carpet with a hardwood floor, tile or linoleum. High-efficiency particulate air (HEPA) cleaners can reduce allergen levels over time. While dander and saliva are the source of cat and dog allergens, urine is the source of allergens from rabbits, hamsters, mice and [place] pigs; so ask a non-allergic family member to clean the animal's cage. If you have a pet allergy, talk to your allergist about the potential for allergy immunotherapy (allergy shots). This strategy can often provide long-term relief. Mold Control Mold and fungi can grow on a variety of surfaces provided certain temperature and moisture conditions exist.  Outdoor molds grow on plants, decaying vegetation and soil. The major outdoor mold, Alternaria and Cladosporium, are found in very high numbers during hot and dry conditions. Generally, a late summer - fall peak is seen for common outdoor fungal spores. Rain will temporarily lower outdoor mold spore count, but counts rise rapidly when the rainy period ends. The most important indoor molds are Aspergillus and Penicillium. Dark, humid and poorly ventilated basements are ideal sites for mold growth. The next most common sites of mold growth are the bathroom and the kitchen. Outdoor (Seasonal) Mold Control Use air conditioning and keep windows closed. Avoid exposure to decaying vegetation. Avoid leaf raking. Avoid grain handling. Consider wearing a face mask if working in moldy areas.  Indoor (Perennial) Mold Control  Maintain humidity below 50%. Get rid of mold growth on hard surfaces with water, detergent and, if necessary, 5% bleach (do not mix with other cleaners). Then dry the area completely. If mold covers an area more than 10 square feet, consider hiring an indoor environmental professional. For clothing, washing with soap and water is best. If moldy items cannot be cleaned and dried, throw them away. Remove sources e.g.  contaminated carpets. Repair and seal leaking roofs or pipes. Using dehumidifiers in damp basements may be helpful, but empty the water and clean units regularly to prevent mildew from forming. All rooms, especially basements, bathrooms and kitchens, require ventilation and cleaning to deter mold and mildew growth. Avoid carpeting on concrete or damp floors, and storing items in damp areas.

## 2021-04-04 NOTE — Progress Notes (Signed)
Follow Up Note  RE: THERESSA Armstrong MRN: [id number] DOB: 1965/11/21 Date of Office Visit: 04/04/2021  Referring provider: Lujean Amel, MD Primary care provider: Lujean Amel, MD  Chief Complaint: Asthma  History of Present Illness: I had the pleasure of seeing Donna Armstrong for a follow up visit at the [facility] on 04/04/2021. She is a 55 y.o. female, who is being followed for allergic rhinitis on AIT and reactive airway disease. Her previous allergy office visit was on 09/27/2020 with Dr. Maudie Mercury. Today is a regular follow up visit.  Mild intermittent reactive airway disease Only had to use albuterol during rowing.   Denies any SOB, coughing, wheezing, chest tightness, nocturnal awakenings, ER/urgent care visits or prednisone use since the last visit.  Did not have to use Dulera.    Allergic rhinitis Patient had some localized reaction since on red vial. Using cortisone cream as needed. Currently takes allegra once a day in the morning. Noticed some improvements in her allergic symptoms.  Using Flonase as needed with good benefit.   Patient did not get Moderna booster or shingles vaccine.    Assessment and Plan: [name] is a 55 y.o. female with: Mild intermittent reactive airway disease Past history - Exercise and URIs tend to trigger her asthma. Uses albuterol as needed with good benefit. In January 2021 had to use nebulizer due to her symptoms. Advair Diskus caused coughing in the past. 2021 spirometry was unremarkable.  Interim history - only used albuterol with rowing. ACT score 24.  Today's spirometry showed some mild restriction. Daily controller medication(s): none. Prior to physical activity: May use albuterol rescue inhaler 2 puffs 5 to 15 minutes prior to strenuous physical activities. Rescue medications: May use albuterol rescue inhaler 2 puffs or nebulizer every 4 to 6 hours as needed for shortness of breath, chest tightness, coughing, and  wheezing. Monitor frequency of use.  During upper respiratory infections/asthma flares: start Dulera 139mg 2 puffs twice a day with spacer and rinse mouth afterwards for 1-2 weeks until your breathing symptoms return to baseline.   Other allergic rhinitis Past history - Perennial rhinitis symptoms for 10+ years. Was on allergy immunotherapy in the past with good benefit and interested in re-starting. Last skin testing was over 3 years ago. No recent ENT evaluation. 2021 skin testing showed: Positive to dust mites, cat and mold. 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 Interim history -  Some localized reaction with red vial.  Continue environmental control measures as below. May use Flonase (fluticasone) nasal spray 1 spray per nostril twice a day as needed for nasal congestion.  Nasal saline spray (i.e., Simply Saline) or nasal saline lavage (i.e., NeilMed) is recommended as needed and prior to medicated nasal sprays. Continue allergy injections - shot given today.  You may take your allergy medication twice a day the day before and the day of injection.  May use over the counter antihistamines such as Zyrtec (cetirizine), Claritin (loratadine), Allegra (fexofenadine), or Xyzal (levocetirizine) daily as needed.  Return in about 6 months (around 10/04/2021).  Meds ordered this encounter  Medications   albuterol (VENTOLIN HFA) 108 (90 Base) MCG/ACT inhaler    Sig: Inhale 2 puffs into the lungs every 4 (four) hours as needed for wheezing or shortness of breath (coughing fits).    Dispense:  18 g    Refill:  2    Lab Orders  No laboratory test(s) ordered today    Diagnostics: Spirometry:  Tracings reviewed. Her effort:  Good reproducible efforts. FVC: 2.59L FEV1: 2.06L, 75% predicted FEV1/FVC ratio: 80% Interpretation: Spirometry consistent with possible restrictive disease.  Please see scanned spirometry results for details.  Medication List:  Current Outpatient Medications  Medication  Sig Dispense Refill   Cholecalciferol (VITAMIN D PO) Take 1 tablet by mouth daily.     fluticasone (FLONASE) 50 MCG/ACT nasal spray Place 1 spray into both nostrils daily as needed for allergies.      mometasone-formoterol (DULERA) 100-5 MCG/ACT AERO Inhale 2 puffs into the lungs in the morning and at bedtime. Use during upper respiratory infections for 1-2 weeks. 1 each 3   Omega 3 1200 MG CAPS Take 2 capsules by mouth daily.     Probiotic Product (PROBIOTIC DAILY) CAPS 1 capsule     Turmeric 500 MG CAPS Take 1 capsule by mouth daily.     albuterol (VENTOLIN HFA) 108 (90 Base) MCG/ACT inhaler Inhale 2 puffs into the lungs every 4 (four) hours as needed for wheezing or shortness of breath (coughing fits). 18 g 2   Ascorbic Acid (VITAMIN C) 100 MG tablet Take by mouth daily.     metoprolol tartrate (LOPRESSOR) 25 MG tablet Take 0.5 tablets (12.5 mg total) by mouth 2 (two) times daily. (Patient not taking: No sig reported) 90 tablet 3   NATURE-THROID 32.5 MG tablet Take 32.5 mg by mouth daily. (Patient not taking: Reported on 04/04/2021)     No current facility-administered medications for this visit.   Allergies: Allergies  Allergen Reactions   Ketek [Telithromycin] Other (See Comments)    Dizziness    Tape    Erythromycin Rash   I reviewed her past medical history, social history, family history, and environmental history and no significant changes have been reported from her previous visit.  Review of Systems  Constitutional:  Negative for appetite change, chills, fever and unexpected weight change.  HENT:  Negative for congestion and rhinorrhea.   Eyes:  Negative for itching.  Respiratory:  Negative for cough, chest tightness, shortness of breath and wheezing.   Cardiovascular:  Negative for chest pain.  Gastrointestinal:  Negative for abdominal pain.  Genitourinary:  Negative for difficulty urinating.  Skin:  Negative for rash.  Allergic/Immunologic: Positive for environmental  allergies.  Neurological:  Negative for headaches.  Objective: BP 112/70 (BP Location: Left Arm, Patient Position: Sitting, Cuff Size: Normal)   Pulse 71   Temp 97.9 F (36.6 C) (Temporal)   Resp 18   Ht 5' 4"  (1.626 m)   Wt 188 lb 3.2 oz (85.4 kg)   SpO2 97%   BMI 32.30 kg/m  Body mass index is 32.3 kg/m. Physical Exam Vitals and nursing note reviewed.  Constitutional:      Appearance: Normal appearance. She is well-developed.  HENT:     Head: Normocephalic and atraumatic.     Right Ear: External ear normal.     Left Ear: External ear normal.     Nose: Nose normal.     Mouth/Throat:     Mouth: Mucous membranes are moist.     Pharynx: Oropharynx is clear.  Eyes:     Conjunctiva/sclera: Conjunctivae normal.  Cardiovascular:     Rate and Rhythm: Normal rate and regular rhythm.     Heart sounds: Normal heart sounds. No murmur heard.   No friction rub. No gallop.  Pulmonary:     Effort: Pulmonary effort is normal.     Breath sounds: Normal breath sounds. No wheezing, rhonchi or rales.  Musculoskeletal:  Cervical back: Neck supple.  Skin:    General: Skin is warm.     Findings: No rash.  Neurological:     Mental Status: She is alert and oriented to person, place, and time.  Psychiatric:        Behavior: Behavior normal.  Previous notes and tests were reviewed. The plan was reviewed with the patient/family, and all questions/concerned were addressed.  It was my pleasure to see Donna Armstrong today and participate in her care. Please feel free to contact me with any questions or concerns.  Sincerely,  Rexene Alberts, DO Allergy & Immunology  [facility] office: [place] office: [phone]

## 2021-04-04 NOTE — Assessment & Plan Note (Signed)
Past history - Exercise and URIs tend to trigger her asthma. Uses albuterol as needed with good benefit. In January 2021 had to use nebulizer due to her symptoms. Advair Diskus caused coughing in the past. 2021 spirometry was unremarkable.  Interim history - only used albuterol with rowing.  ACT score 24.   Today's spirometry showed some mild restriction. . Daily controller medication(s): none. . Prior to physical activity: May use albuterol rescue inhaler 2 puffs 5 to 15 minutes prior to strenuous physical activities. Marland Kitchen Rescue medications: May use albuterol rescue inhaler 2 puffs or nebulizer every 4 to 6 hours as needed for shortness of breath, chest tightness, coughing, and wheezing. Monitor frequency of use.  . During upper respiratory infections/asthma flares: start Dulera 162mg 2 puffs twice a day with spacer and rinse mouth afterwards for 1-2 weeks until your breathing symptoms return to baseline.

## 2021-04-04 NOTE — Assessment & Plan Note (Signed)
Past history - Perennial rhinitis symptoms for 10+ years. Was on allergy immunotherapy in the past with good benefit and interested in re-starting. Last skin testing was over 3 years ago. No recent ENT evaluation. 2021 skin testing showed: Positive to dust mites, cat and mold. 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 Interim history -  Some localized reaction with red vial.   Continue environmental control measures as below.  May use Flonase (fluticasone) nasal spray 1 spray per nostril twice a day as needed for nasal congestion.   Nasal saline spray (i.e., Simply Saline) or nasal saline lavage (i.e., NeilMed) is recommended as needed and prior to medicated nasal sprays.  Continue allergy injections - shot given today.   You may take your allergy medication twice a day the day before and the day of injection.   May use over the counter antihistamines such as Zyrtec (cetirizine), Claritin (loratadine), Allegra (fexofenadine), or Xyzal (levocetirizine) daily as needed.

## 2021-04-14 ENCOUNTER — Ambulatory Visit (INDEPENDENT_AMBULATORY_CARE_PROVIDER_SITE_OTHER): Payer: 59

## 2021-04-14 DIAGNOSIS — J309 Allergic rhinitis, unspecified: Secondary | ICD-10-CM | POA: Diagnosis not present

## 2021-04-20 ENCOUNTER — Ambulatory Visit (INDEPENDENT_AMBULATORY_CARE_PROVIDER_SITE_OTHER): Payer: 59

## 2021-04-20 DIAGNOSIS — J309 Allergic rhinitis, unspecified: Secondary | ICD-10-CM

## 2021-04-27 DIAGNOSIS — J3089 Other allergic rhinitis: Secondary | ICD-10-CM

## 2021-04-27 NOTE — Progress Notes (Signed)
VIAL MADE. EXP 04-27-22

## 2021-05-13 ENCOUNTER — Ambulatory Visit (INDEPENDENT_AMBULATORY_CARE_PROVIDER_SITE_OTHER): Payer: 59

## 2021-05-13 DIAGNOSIS — J309 Allergic rhinitis, unspecified: Secondary | ICD-10-CM

## 2021-05-25 ENCOUNTER — Ambulatory Visit (INDEPENDENT_AMBULATORY_CARE_PROVIDER_SITE_OTHER): Payer: 59

## 2021-05-25 DIAGNOSIS — J309 Allergic rhinitis, unspecified: Secondary | ICD-10-CM

## 2021-06-02 ENCOUNTER — Ambulatory Visit (INDEPENDENT_AMBULATORY_CARE_PROVIDER_SITE_OTHER): Payer: 59 | Admitting: *Deleted

## 2021-06-02 DIAGNOSIS — J309 Allergic rhinitis, unspecified: Secondary | ICD-10-CM

## 2021-06-08 ENCOUNTER — Ambulatory Visit (INDEPENDENT_AMBULATORY_CARE_PROVIDER_SITE_OTHER): Payer: 59

## 2021-06-08 DIAGNOSIS — J309 Allergic rhinitis, unspecified: Secondary | ICD-10-CM | POA: Diagnosis not present

## 2021-06-15 ENCOUNTER — Ambulatory Visit (INDEPENDENT_AMBULATORY_CARE_PROVIDER_SITE_OTHER): Payer: 59

## 2021-06-15 DIAGNOSIS — J309 Allergic rhinitis, unspecified: Secondary | ICD-10-CM

## 2021-06-23 ENCOUNTER — Ambulatory Visit (INDEPENDENT_AMBULATORY_CARE_PROVIDER_SITE_OTHER): Payer: 59

## 2021-06-23 DIAGNOSIS — J309 Allergic rhinitis, unspecified: Secondary | ICD-10-CM

## 2021-06-29 ENCOUNTER — Other Ambulatory Visit: Payer: Self-pay | Admitting: Gastroenterology

## 2021-06-29 ENCOUNTER — Ambulatory Visit (INDEPENDENT_AMBULATORY_CARE_PROVIDER_SITE_OTHER): Payer: 59

## 2021-06-29 DIAGNOSIS — R945 Abnormal results of liver function studies: Secondary | ICD-10-CM

## 2021-06-29 DIAGNOSIS — J309 Allergic rhinitis, unspecified: Secondary | ICD-10-CM

## 2021-06-29 DIAGNOSIS — R748 Abnormal levels of other serum enzymes: Secondary | ICD-10-CM

## 2021-06-29 DIAGNOSIS — R7989 Other specified abnormal findings of blood chemistry: Secondary | ICD-10-CM

## 2021-07-07 ENCOUNTER — Ambulatory Visit (INDEPENDENT_AMBULATORY_CARE_PROVIDER_SITE_OTHER): Payer: 59 | Admitting: *Deleted

## 2021-07-07 DIAGNOSIS — J309 Allergic rhinitis, unspecified: Secondary | ICD-10-CM

## 2021-07-13 ENCOUNTER — Other Ambulatory Visit: Payer: Self-pay

## 2021-07-13 ENCOUNTER — Ambulatory Visit
Admission: RE | Admit: 2021-07-13 | Discharge: 2021-07-13 | Disposition: A | Discharge status: Home / Self Care | Payer: 59 | Source: Ambulatory Visit | Attending: Gastroenterology | Admitting: Gastroenterology

## 2021-07-13 ENCOUNTER — Other Ambulatory Visit: Payer: Self-pay | Admitting: Gastroenterology

## 2021-07-13 DIAGNOSIS — R748 Abnormal levels of other serum enzymes: Secondary | ICD-10-CM

## 2021-07-13 DIAGNOSIS — R7989 Other specified abnormal findings of blood chemistry: Secondary | ICD-10-CM

## 2021-07-13 DIAGNOSIS — R945 Abnormal results of liver function studies: Secondary | ICD-10-CM

## 2021-07-13 DIAGNOSIS — K5000 Crohn's disease of small intestine without complications: Secondary | ICD-10-CM

## 2021-07-13 MED ORDER — IOPAMIDOL (ISOVUE-300) INJECTION 61%
100.0000 mL | Freq: Once | INTRAVENOUS | Status: AC | PRN
  Administered 2021-07-13: 100 mL via INTRAVENOUS

## 2021-07-19 ENCOUNTER — Other Ambulatory Visit: Payer: Self-pay | Admitting: Gastroenterology

## 2021-07-19 DIAGNOSIS — K869 Disease of pancreas, unspecified: Secondary | ICD-10-CM

## 2021-08-01 ENCOUNTER — Ambulatory Visit (INDEPENDENT_AMBULATORY_CARE_PROVIDER_SITE_OTHER): Payer: 59

## 2021-08-01 DIAGNOSIS — J309 Allergic rhinitis, unspecified: Secondary | ICD-10-CM | POA: Diagnosis not present

## 2021-08-07 ENCOUNTER — Ambulatory Visit
Admission: RE | Admit: 2021-08-07 | Discharge: 2021-08-07 | Disposition: A | Discharge status: Home / Self Care | Payer: 59 | Source: Ambulatory Visit | Attending: Gastroenterology | Admitting: Gastroenterology

## 2021-08-07 ENCOUNTER — Other Ambulatory Visit: Payer: 59

## 2021-08-07 DIAGNOSIS — K869 Disease of pancreas, unspecified: Secondary | ICD-10-CM

## 2021-08-07 MED ORDER — GADOBENATE DIMEGLUMINE 529 MG/ML IV SOLN
17.0000 mL | Freq: Once | INTRAVENOUS | Status: AC | PRN
  Administered 2021-08-07: 17 mL via INTRAVENOUS

## 2021-08-11 ENCOUNTER — Ambulatory Visit (INDEPENDENT_AMBULATORY_CARE_PROVIDER_SITE_OTHER): Payer: 59 | Admitting: *Deleted

## 2021-08-11 DIAGNOSIS — J309 Allergic rhinitis, unspecified: Secondary | ICD-10-CM | POA: Diagnosis not present

## 2021-08-16 ENCOUNTER — Ambulatory Visit (INDEPENDENT_AMBULATORY_CARE_PROVIDER_SITE_OTHER): Payer: 59 | Admitting: *Deleted

## 2021-08-16 ENCOUNTER — Other Ambulatory Visit: Payer: Self-pay

## 2021-08-16 DIAGNOSIS — Z23 Encounter for immunization: Secondary | ICD-10-CM | POA: Diagnosis not present

## 2021-08-17 DIAGNOSIS — J3089 Other allergic rhinitis: Secondary | ICD-10-CM | POA: Diagnosis not present

## 2021-08-17 NOTE — Progress Notes (Signed)
VIALS MADE. EXP 08-17-22

## 2021-09-06 ENCOUNTER — Ambulatory Visit (INDEPENDENT_AMBULATORY_CARE_PROVIDER_SITE_OTHER): Payer: 59

## 2021-09-06 DIAGNOSIS — J309 Allergic rhinitis, unspecified: Secondary | ICD-10-CM

## 2021-09-21 ENCOUNTER — Ambulatory Visit (INDEPENDENT_AMBULATORY_CARE_PROVIDER_SITE_OTHER): Payer: 59

## 2021-09-21 DIAGNOSIS — J309 Allergic rhinitis, unspecified: Secondary | ICD-10-CM

## 2021-09-29 ENCOUNTER — Ambulatory Visit (INDEPENDENT_AMBULATORY_CARE_PROVIDER_SITE_OTHER): Payer: 59

## 2021-09-29 DIAGNOSIS — J309 Allergic rhinitis, unspecified: Secondary | ICD-10-CM | POA: Diagnosis not present

## 2021-10-11 ENCOUNTER — Ambulatory Visit (INDEPENDENT_AMBULATORY_CARE_PROVIDER_SITE_OTHER): Payer: 59 | Admitting: *Deleted

## 2021-10-11 DIAGNOSIS — J309 Allergic rhinitis, unspecified: Secondary | ICD-10-CM

## 2021-11-02 ENCOUNTER — Ambulatory Visit (INDEPENDENT_AMBULATORY_CARE_PROVIDER_SITE_OTHER): Payer: 59

## 2021-11-02 DIAGNOSIS — J309 Allergic rhinitis, unspecified: Secondary | ICD-10-CM | POA: Diagnosis not present

## 2021-12-12 ENCOUNTER — Ambulatory Visit (INDEPENDENT_AMBULATORY_CARE_PROVIDER_SITE_OTHER): Payer: 59

## 2021-12-12 DIAGNOSIS — J309 Allergic rhinitis, unspecified: Secondary | ICD-10-CM

## 2022-01-12 ENCOUNTER — Ambulatory Visit (INDEPENDENT_AMBULATORY_CARE_PROVIDER_SITE_OTHER): Payer: 59

## 2022-01-12 DIAGNOSIS — J309 Allergic rhinitis, unspecified: Secondary | ICD-10-CM

## 2022-01-26 ENCOUNTER — Ambulatory Visit (INDEPENDENT_AMBULATORY_CARE_PROVIDER_SITE_OTHER): Payer: 59

## 2022-01-26 DIAGNOSIS — J309 Allergic rhinitis, unspecified: Secondary | ICD-10-CM | POA: Diagnosis not present

## 2022-02-08 ENCOUNTER — Ambulatory Visit (INDEPENDENT_AMBULATORY_CARE_PROVIDER_SITE_OTHER): Payer: 59 | Admitting: *Deleted

## 2022-02-08 DIAGNOSIS — J309 Allergic rhinitis, unspecified: Secondary | ICD-10-CM | POA: Diagnosis not present

## 2022-02-20 ENCOUNTER — Ambulatory Visit (INDEPENDENT_AMBULATORY_CARE_PROVIDER_SITE_OTHER): Payer: 59

## 2022-02-20 DIAGNOSIS — J309 Allergic rhinitis, unspecified: Secondary | ICD-10-CM

## 2022-03-01 ENCOUNTER — Ambulatory Visit (INDEPENDENT_AMBULATORY_CARE_PROVIDER_SITE_OTHER): Payer: 59

## 2022-03-01 DIAGNOSIS — J309 Allergic rhinitis, unspecified: Secondary | ICD-10-CM

## 2022-03-28 ENCOUNTER — Ambulatory Visit (INDEPENDENT_AMBULATORY_CARE_PROVIDER_SITE_OTHER): Payer: 59

## 2022-03-28 DIAGNOSIS — J309 Allergic rhinitis, unspecified: Secondary | ICD-10-CM | POA: Diagnosis not present

## 2022-04-06 ENCOUNTER — Ambulatory Visit (INDEPENDENT_AMBULATORY_CARE_PROVIDER_SITE_OTHER): Payer: 59

## 2022-04-06 DIAGNOSIS — J309 Allergic rhinitis, unspecified: Secondary | ICD-10-CM | POA: Diagnosis not present

## 2022-04-12 ENCOUNTER — Ambulatory Visit (INDEPENDENT_AMBULATORY_CARE_PROVIDER_SITE_OTHER): Payer: 59

## 2022-04-12 DIAGNOSIS — J309 Allergic rhinitis, unspecified: Secondary | ICD-10-CM

## 2022-04-25 ENCOUNTER — Ambulatory Visit (INDEPENDENT_AMBULATORY_CARE_PROVIDER_SITE_OTHER): Payer: 59

## 2022-04-25 DIAGNOSIS — J309 Allergic rhinitis, unspecified: Secondary | ICD-10-CM | POA: Diagnosis not present

## 2022-05-04 ENCOUNTER — Ambulatory Visit (INDEPENDENT_AMBULATORY_CARE_PROVIDER_SITE_OTHER): Payer: 59

## 2022-05-04 DIAGNOSIS — J309 Allergic rhinitis, unspecified: Secondary | ICD-10-CM

## 2022-05-11 ENCOUNTER — Ambulatory Visit (INDEPENDENT_AMBULATORY_CARE_PROVIDER_SITE_OTHER): Payer: 59

## 2022-05-11 DIAGNOSIS — J309 Allergic rhinitis, unspecified: Secondary | ICD-10-CM | POA: Diagnosis not present

## 2022-05-15 ENCOUNTER — Ambulatory Visit (INDEPENDENT_AMBULATORY_CARE_PROVIDER_SITE_OTHER): Payer: 59

## 2022-05-15 DIAGNOSIS — J309 Allergic rhinitis, unspecified: Secondary | ICD-10-CM | POA: Diagnosis not present

## 2022-05-19 ENCOUNTER — Other Ambulatory Visit: Payer: Self-pay | Admitting: Family Medicine

## 2022-05-19 DIAGNOSIS — M8580 Other specified disorders of bone density and structure, unspecified site: Secondary | ICD-10-CM

## 2022-05-23 LAB — LAB REPORT - SCANNED: EGFR: 92

## 2022-05-24 ENCOUNTER — Ambulatory Visit (INDEPENDENT_AMBULATORY_CARE_PROVIDER_SITE_OTHER): Payer: 59

## 2022-05-24 DIAGNOSIS — J309 Allergic rhinitis, unspecified: Secondary | ICD-10-CM | POA: Diagnosis not present

## 2022-05-25 DIAGNOSIS — J3089 Other allergic rhinitis: Secondary | ICD-10-CM

## 2022-05-25 NOTE — Progress Notes (Signed)
VIAL EXP 05-26-23

## 2022-05-31 ENCOUNTER — Ambulatory Visit (INDEPENDENT_AMBULATORY_CARE_PROVIDER_SITE_OTHER): Payer: 59

## 2022-05-31 DIAGNOSIS — J309 Allergic rhinitis, unspecified: Secondary | ICD-10-CM

## 2022-06-06 ENCOUNTER — Ambulatory Visit (INDEPENDENT_AMBULATORY_CARE_PROVIDER_SITE_OTHER): Payer: 59 | Admitting: *Deleted

## 2022-06-06 DIAGNOSIS — J309 Allergic rhinitis, unspecified: Secondary | ICD-10-CM

## 2022-06-14 ENCOUNTER — Ambulatory Visit (INDEPENDENT_AMBULATORY_CARE_PROVIDER_SITE_OTHER): Payer: 59 | Admitting: *Deleted

## 2022-06-14 DIAGNOSIS — J309 Allergic rhinitis, unspecified: Secondary | ICD-10-CM | POA: Diagnosis not present

## 2022-06-21 ENCOUNTER — Ambulatory Visit (INDEPENDENT_AMBULATORY_CARE_PROVIDER_SITE_OTHER): Payer: 59 | Admitting: *Deleted

## 2022-06-21 DIAGNOSIS — J309 Allergic rhinitis, unspecified: Secondary | ICD-10-CM | POA: Diagnosis not present

## 2022-06-29 ENCOUNTER — Ambulatory Visit (INDEPENDENT_AMBULATORY_CARE_PROVIDER_SITE_OTHER): Payer: 59

## 2022-06-29 DIAGNOSIS — J309 Allergic rhinitis, unspecified: Secondary | ICD-10-CM | POA: Diagnosis not present

## 2022-07-20 NOTE — Progress Notes (Signed)
Follow Up Note  RE: Donna Armstrong MRN: [id number] DOB: April 28, 1966 Date of Office Visit: 07/21/2022  Referring provider: Lujean Amel, MD Primary care provider: Lujean Amel, MD  Chief Complaint: Follow-up (No complaints. Refill inhaler.)  History of Present Illness: I had the pleasure of seeing Donna Armstrong for a follow up visit at the [facility] [place] on 07/21/2022. She is a 56 y.o. female, who is being followed for reactive airway disease and allergic rhinitis on AIT. Her previous allergy office visit was on 04/04/2021 with Dr. Maudie Mercury. Today is a regular follow up visit.  Mild intermittent reactive airway disease Usually flares with URIs and had no URIs since the last visit.  Denies any SOB, coughing, wheezing, chest tightness, nocturnal awakenings, ER/urgent care visits or prednisone use since the last visit.   Allergic rhinitis Taking allegra on the days of injections with good benefit. Some nasal congestion but not bad enough to take a nasal spray.  She has missed some shots due to our recent move and her work schedule.   Trying to lose weight and hoping to get a treadmill to walk on while working at her desk. She's also seeing an integrative physician who started her on some supplements.   Assessment and Plan: [name] is a 56 y.o. female with: Mild intermittent reactive airway disease Past history - Exercise and URIs tend to trigger her asthma. Uses albuterol as needed with good benefit. In January 2021 had to use nebulizer due to her symptoms. Advair Diskus caused coughing in the past. 2021 spirometry was unremarkable.  Interim history - no issues as she had no URIs since the last visit.  Daily controller medication(s): none. During upper respiratory infections/flares:  Start Dulera 153mg 2 puffs twice a day with spacer and rinse mouth afterwards for 1-2 weeks until your breathing symptoms return to baseline.  Pretreat with albuterol 2 puffs or albuterol  nebulizer.  If you need to use your albuterol nebulizer machine back to back within 15-30 minutes with no relief then please go to the ER/urgent care for further evaluation.  May use albuterol rescue inhaler 2 puffs or nebulizer every 4 to 6 hours as needed for shortness of breath, chest tightness, coughing, and wheezing. May use albuterol rescue inhaler 2 puffs 5 to 15 minutes prior to strenuous physical activities. Monitor frequency of use.  Get spirometry at next visit. Flu shot given today - VIS given.  Other allergic rhinitis Past history - Perennial rhinitis symptoms for 10+ years. Was on allergy immunotherapy in the past with good benefit and interested in re-starting. Last skin testing was over 3 years ago. No recent ENT evaluation. 2021 skin testing showed: Positive to dust mites, cat and mold. 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 Interim history -  Localized reactions better with allegra pretreatment.  Continue environmental control measures. May use Flonase (fluticasone) nasal spray 1 spray per nostril twice a day as needed for nasal congestion.  Nasal saline spray (i.e., Simply Saline) or nasal saline lavage (i.e., NeilMed) is recommended as needed and prior to medicated nasal sprays. Use over the counter antihistamines such as Zyrtec (cetirizine), Claritin (loratadine), Allegra (fexofenadine), or Xyzal (levocetirizine) daily as needed. May take twice a day during allergy flares. May switch antihistamines every few months. Continue allergy injections - come next week.  Take allegra on the days of your injections.   Return in about 6 months (around 01/20/2023).  No orders of the defined types were placed in this encounter.  Lab Orders  No laboratory test(s) ordered today    Diagnostics: None.   Medication List:  Current Outpatient Medications  Medication Sig Dispense Refill   albuterol (PROVENTIL) (2.5 MG/3ML) 0.083% nebulizer solution SMARTSIG:3 Milliliter(s) Via Nebulizer Every  6 Hours PRN     albuterol (VENTOLIN HFA) 108 (90 Base) MCG/ACT inhaler Inhale 2 puffs into the lungs every 4 (four) hours as needed for wheezing or shortness of breath (coughing fits). 18 g 2   CALCIUM PO Take by mouth daily.     Cholecalciferol (VITAMIN D PO) Take 1 tablet by mouth daily.     cholecalciferol (VITAMIN D3) 25 MCG (1000 UNIT) tablet 1 tablet Orally Once a day for 30 day(s)     COLLAGEN PO Take by mouth daily.     Menaquinone-7 (VITAMIN K2 PO) Take by mouth daily.     Oyster Shell Calcium 500 MG TABS 1 tablet with meals Orally Twice a day for 30 day(s)     Probiotic Product (PROBIOTIC DAILY) CAPS 1 capsule     UNABLE TO FIND Take by mouth daily. Metagenics Licorice Plus     VITAMIN A PO Take by mouth daily.     Ascorbic Acid (VITAMIN C) 100 MG tablet Take by mouth daily.     fluticasone (FLONASE) 50 MCG/ACT nasal spray Place 1 spray into both nostrils daily as needed for allergies.  (Patient not taking: Reported on 07/21/2022)     metoprolol tartrate (LOPRESSOR) 25 MG tablet Take 0.5 tablets (12.5 mg total) by mouth 2 (two) times daily. (Patient not taking: Reported on 03/20/2018) 90 tablet 3   mometasone-formoterol (DULERA) 100-5 MCG/ACT AERO Inhale 2 puffs into the lungs in the morning and at bedtime. Use during upper respiratory infections for 1-2 weeks. (Patient not taking: Reported on 07/21/2022) 1 each 3   NATURE-THROID 32.5 MG tablet Take 32.5 mg by mouth daily. (Patient not taking: Reported on 04/04/2021)     Omega 3 1200 MG CAPS Take 2 capsules by mouth daily. (Patient not taking: Reported on 07/21/2022)     Turmeric 500 MG CAPS Take 1 capsule by mouth daily. (Patient not taking: Reported on 07/21/2022)     No current facility-administered medications for this visit.   Allergies: Allergies  Allergen Reactions   Ketek [Telithromycin] Other (See Comments)    Dizziness    Tape    Erythromycin Rash   I reviewed her past medical history, social history, family history, and  environmental history and no significant changes have been reported from her previous visit.  Review of Systems  Constitutional:  Negative for appetite change, chills, fever and unexpected weight change.  HENT:  Negative for congestion and rhinorrhea.   Eyes:  Negative for itching.  Respiratory:  Negative for cough, chest tightness, shortness of breath and wheezing.   Cardiovascular:  Negative for chest pain.  Gastrointestinal:  Negative for abdominal pain.  Genitourinary:  Negative for difficulty urinating.  Skin:  Negative for rash.  Allergic/Immunologic: Positive for environmental allergies.  Neurological:  Negative for headaches.    Objective: BP 118/74   Pulse 89   Temp 98.4 F (36.9 C) (Temporal)   Resp 12   Wt 190 lb 12.8 oz (86.5 kg)   SpO2 94%   BMI 32.75 kg/m  Body mass index is 32.75 kg/m. Physical Exam Vitals and nursing note reviewed.  Constitutional:      Appearance: Normal appearance. She is well-developed.  HENT:     Head: Normocephalic and atraumatic.  Right Ear: External ear normal.     Left Ear: External ear normal.     Nose: Nose normal.     Mouth/Throat:     Mouth: Mucous membranes are moist.     Pharynx: Oropharynx is clear.  Eyes:     Conjunctiva/sclera: Conjunctivae normal.  Cardiovascular:     Rate and Rhythm: Normal rate and regular rhythm.     Heart sounds: Normal heart sounds. No murmur heard.    No friction rub. No gallop.  Pulmonary:     Effort: Pulmonary effort is normal.     Breath sounds: Normal breath sounds. No wheezing, rhonchi or rales.  Musculoskeletal:     Cervical back: Neck supple.  Skin:    General: Skin is warm.     Findings: No rash.  Neurological:     Mental Status: She is alert and oriented to person, place, and time.  Psychiatric:        Behavior: Behavior normal.    Previous notes and tests were reviewed. The plan was reviewed with the patient/family, and all questions/concerned were addressed.  It was my  pleasure to see Charlisa today and participate in her care. Please feel free to contact me with any questions or concerns.  Sincerely,  Rexene Alberts, DO Allergy & Immunology  [facility] office: [place] office: [phone]

## 2022-07-21 ENCOUNTER — Encounter: Payer: Self-pay | Admitting: Allergy

## 2022-07-21 ENCOUNTER — Ambulatory Visit (INDEPENDENT_AMBULATORY_CARE_PROVIDER_SITE_OTHER): Payer: 59 | Admitting: Allergy

## 2022-07-21 VITALS — BP 118/74 | HR 89 | Temp 98.4°F | Resp 12 | Wt 190.8 lb

## 2022-07-21 DIAGNOSIS — J3089 Other allergic rhinitis: Secondary | ICD-10-CM

## 2022-07-21 DIAGNOSIS — Z23 Encounter for immunization: Secondary | ICD-10-CM

## 2022-07-21 DIAGNOSIS — J4520 Mild intermittent asthma, uncomplicated: Secondary | ICD-10-CM

## 2022-07-21 NOTE — Assessment & Plan Note (Signed)
Past history - Exercise and URIs tend to trigger her asthma. Uses albuterol as needed with good benefit. In January 2021 had to use nebulizer due to her symptoms. Advair Diskus caused coughing in the past. 2021 spirometry was unremarkable.  Interim history - no issues as she had no URIs since the last visit.  . Daily controller medication(s): none. . During upper respiratory infections/flares:  . Start Dulera 121mg 2 puffs twice a day with spacer and rinse mouth afterwards for 1-2 weeks until your breathing symptoms return to baseline.  . Pretreat with albuterol 2 puffs or albuterol nebulizer.  . If you need to use your albuterol nebulizer machine back to back within 15-30 minutes with no relief then please go to the ER/urgent care for further evaluation.  . May use albuterol rescue inhaler 2 puffs or nebulizer every 4 to 6 hours as needed for shortness of breath, chest tightness, coughing, and wheezing. May use albuterol rescue inhaler 2 puffs 5 to 15 minutes prior to strenuous physical activities. Monitor frequency of use.  . Get spirometry at next visit. . Flu shot given today - VIS given.

## 2022-07-21 NOTE — Patient Instructions (Addendum)
Flu shot given today. Handout given.  Environmental allergies Past skin testing showed: Positive to dust mites, cat and mold. Continue environmental control measures. May use Flonase (fluticasone) nasal spray 1 spray per nostril twice a day as needed for nasal congestion.  Nasal saline spray (i.e., Simply Saline) or nasal saline lavage (i.e., NeilMed) is recommended as needed and prior to medicated nasal sprays. Use over the counter antihistamines such as Zyrtec (cetirizine), Claritin (loratadine), Allegra (fexofenadine), or Xyzal (levocetirizine) daily as needed. May take twice a day during allergy flares. May switch antihistamines every few months.  Continue allergy injections - come next week.  Take allegra on the days of your injections.   Breathing:  Daily controller medication(s): none. During upper respiratory infections/flares:  Start Dulera 131mg 2 puffs twice a day with spacer and rinse mouth afterwards for 1-2 weeks until your breathing symptoms return to baseline.  Pretreat with albuterol 2 puffs or albuterol nebulizer.  If you need to use your albuterol nebulizer machine back to back within 15-30 minutes with no relief then please go to the ER/urgent care for further evaluation.  May use albuterol rescue inhaler 2 puffs or nebulizer every 4 to 6 hours as needed for shortness of breath, chest tightness, coughing, and wheezing. May use albuterol rescue inhaler 2 puffs 5 to 15 minutes prior to strenuous physical activities. Monitor frequency of use.  Asthma control goals:  Full participation in all desired activities (may need albuterol before activity) Albuterol use two times or less a week on average (not counting use with activity) Cough interfering with sleep two times or less a month Oral steroids no more than once a year No hospitalizations   Follow up in 6 months or sooner if needed.   Recommend getting covid-19 booster and shingles vaccine.

## 2022-07-21 NOTE — Assessment & Plan Note (Signed)
Past history - Perennial rhinitis symptoms for 10+ years. Was on allergy immunotherapy in the past with good benefit and interested in re-starting. Last skin testing was over 3 years ago. No recent ENT evaluation. 2021 skin testing showed: Positive to dust mites, cat and mold. 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 Interim history -  Localized reactions better with allegra pretreatment.   Continue environmental control measures.  May use Flonase (fluticasone) nasal spray 1 spray per nostril twice a day as needed for nasal congestion.   Nasal saline spray (i.e., Simply Saline) or nasal saline lavage (i.e., NeilMed) is recommended as needed and prior to medicated nasal sprays.  Use over the counter antihistamines such as Zyrtec (cetirizine), Claritin (loratadine), Allegra (fexofenadine), or Xyzal (levocetirizine) daily as needed. May take twice a day during allergy flares. May switch antihistamines every few months.  Continue allergy injections - come next week.   Take allegra on the days of your injections.

## 2022-07-27 ENCOUNTER — Ambulatory Visit (INDEPENDENT_AMBULATORY_CARE_PROVIDER_SITE_OTHER): Payer: 59

## 2022-07-27 DIAGNOSIS — J309 Allergic rhinitis, unspecified: Secondary | ICD-10-CM | POA: Diagnosis not present

## 2022-08-16 ENCOUNTER — Ambulatory Visit (INDEPENDENT_AMBULATORY_CARE_PROVIDER_SITE_OTHER): Payer: 59

## 2022-08-16 DIAGNOSIS — J309 Allergic rhinitis, unspecified: Secondary | ICD-10-CM | POA: Diagnosis not present

## 2022-08-23 ENCOUNTER — Ambulatory Visit (INDEPENDENT_AMBULATORY_CARE_PROVIDER_SITE_OTHER): Payer: 59 | Admitting: *Deleted

## 2022-08-23 DIAGNOSIS — J309 Allergic rhinitis, unspecified: Secondary | ICD-10-CM | POA: Diagnosis not present

## 2022-08-31 ENCOUNTER — Ambulatory Visit (INDEPENDENT_AMBULATORY_CARE_PROVIDER_SITE_OTHER): Payer: 59

## 2022-08-31 DIAGNOSIS — J309 Allergic rhinitis, unspecified: Secondary | ICD-10-CM | POA: Diagnosis not present

## 2022-09-12 ENCOUNTER — Ambulatory Visit (INDEPENDENT_AMBULATORY_CARE_PROVIDER_SITE_OTHER): Payer: 59 | Admitting: *Deleted

## 2022-09-12 DIAGNOSIS — J309 Allergic rhinitis, unspecified: Secondary | ICD-10-CM | POA: Diagnosis not present

## 2022-09-20 ENCOUNTER — Ambulatory Visit (INDEPENDENT_AMBULATORY_CARE_PROVIDER_SITE_OTHER): Payer: 59

## 2022-09-20 DIAGNOSIS — J309 Allergic rhinitis, unspecified: Secondary | ICD-10-CM

## 2022-09-26 ENCOUNTER — Ambulatory Visit
Admission: RE | Admit: 2022-09-26 | Discharge: 2022-09-26 | Disposition: A | Discharge status: Home / Self Care | Payer: 59 | Source: Ambulatory Visit | Attending: Family Medicine | Admitting: Family Medicine

## 2022-09-26 ENCOUNTER — Ambulatory Visit (INDEPENDENT_AMBULATORY_CARE_PROVIDER_SITE_OTHER): Payer: 59

## 2022-09-26 DIAGNOSIS — J309 Allergic rhinitis, unspecified: Secondary | ICD-10-CM

## 2022-09-26 DIAGNOSIS — M8580 Other specified disorders of bone density and structure, unspecified site: Secondary | ICD-10-CM

## 2022-10-04 ENCOUNTER — Ambulatory Visit (INDEPENDENT_AMBULATORY_CARE_PROVIDER_SITE_OTHER): Payer: 59

## 2022-10-04 DIAGNOSIS — J309 Allergic rhinitis, unspecified: Secondary | ICD-10-CM | POA: Diagnosis not present

## 2022-11-01 ENCOUNTER — Ambulatory Visit (INDEPENDENT_AMBULATORY_CARE_PROVIDER_SITE_OTHER): Payer: 59

## 2022-11-01 DIAGNOSIS — J309 Allergic rhinitis, unspecified: Secondary | ICD-10-CM | POA: Diagnosis not present

## 2022-11-04 LAB — LAB REPORT - SCANNED: EGFR: 86

## 2022-11-14 ENCOUNTER — Ambulatory Visit (INDEPENDENT_AMBULATORY_CARE_PROVIDER_SITE_OTHER): Payer: 59

## 2022-11-14 DIAGNOSIS — J309 Allergic rhinitis, unspecified: Secondary | ICD-10-CM

## 2022-11-22 ENCOUNTER — Ambulatory Visit (INDEPENDENT_AMBULATORY_CARE_PROVIDER_SITE_OTHER): Payer: 59

## 2022-11-22 DIAGNOSIS — J309 Allergic rhinitis, unspecified: Secondary | ICD-10-CM | POA: Diagnosis not present

## 2022-11-28 ENCOUNTER — Ambulatory Visit (INDEPENDENT_AMBULATORY_CARE_PROVIDER_SITE_OTHER): Payer: 59

## 2022-11-28 DIAGNOSIS — J309 Allergic rhinitis, unspecified: Secondary | ICD-10-CM | POA: Diagnosis not present

## 2022-12-04 ENCOUNTER — Ambulatory Visit (INDEPENDENT_AMBULATORY_CARE_PROVIDER_SITE_OTHER): Payer: 59 | Admitting: *Deleted

## 2022-12-04 DIAGNOSIS — J309 Allergic rhinitis, unspecified: Secondary | ICD-10-CM | POA: Diagnosis not present

## 2022-12-04 NOTE — Progress Notes (Signed)
VIAL EXP 12-05-23

## 2022-12-05 DIAGNOSIS — J3089 Other allergic rhinitis: Secondary | ICD-10-CM | POA: Diagnosis not present

## 2022-12-18 ENCOUNTER — Ambulatory Visit (INDEPENDENT_AMBULATORY_CARE_PROVIDER_SITE_OTHER): Payer: 59

## 2022-12-18 DIAGNOSIS — J309 Allergic rhinitis, unspecified: Secondary | ICD-10-CM | POA: Diagnosis not present

## 2022-12-29 ENCOUNTER — Other Ambulatory Visit: Payer: Self-pay | Admitting: *Deleted

## 2022-12-29 DIAGNOSIS — R748 Abnormal levels of other serum enzymes: Secondary | ICD-10-CM

## 2023-01-01 ENCOUNTER — Inpatient Hospital Stay (HOSPITAL_BASED_OUTPATIENT_CLINIC_OR_DEPARTMENT_OTHER): Payer: 59 | Admitting: Oncology

## 2023-01-01 ENCOUNTER — Inpatient Hospital Stay: Payer: 59 | Attending: Oncology

## 2023-01-01 VITALS — BP 119/77 | HR 70 | Temp 98.1°F | Resp 18 | Ht 64.0 in | Wt 192.0 lb

## 2023-01-01 DIAGNOSIS — J45909 Unspecified asthma, uncomplicated: Secondary | ICD-10-CM

## 2023-01-01 DIAGNOSIS — R7989 Other specified abnormal findings of blood chemistry: Secondary | ICD-10-CM

## 2023-01-01 DIAGNOSIS — R748 Abnormal levels of other serum enzymes: Secondary | ICD-10-CM

## 2023-01-01 DIAGNOSIS — R7303 Prediabetes: Secondary | ICD-10-CM | POA: Diagnosis not present

## 2023-01-01 DIAGNOSIS — R7982 Elevated C-reactive protein (CRP): Secondary | ICD-10-CM | POA: Insufficient documentation

## 2023-01-01 DIAGNOSIS — Z8719 Personal history of other diseases of the digestive system: Secondary | ICD-10-CM | POA: Insufficient documentation

## 2023-01-01 DIAGNOSIS — Z881 Allergy status to other antibiotic agents status: Secondary | ICD-10-CM | POA: Insufficient documentation

## 2023-01-01 DIAGNOSIS — K760 Fatty (change of) liver, not elsewhere classified: Secondary | ICD-10-CM | POA: Insufficient documentation

## 2023-01-01 DIAGNOSIS — Z79899 Other long term (current) drug therapy: Secondary | ICD-10-CM | POA: Insufficient documentation

## 2023-01-01 LAB — CBC WITH DIFFERENTIAL (CANCER CENTER ONLY)
Abs Immature Granulocytes: 0.01 10*3/uL (ref 0.00–0.07)
Basophils Absolute: 0 10*3/uL (ref 0.0–0.1)
Basophils Relative: 0 %
Eosinophils Absolute: 0.1 10*3/uL (ref 0.0–0.5)
Eosinophils Relative: 2 %
HCT: 42.1 % (ref 36.0–46.0)
Hemoglobin: 13.7 g/dL (ref 12.0–15.0)
Immature Granulocytes: 0 %
Lymphocytes Relative: 29 %
Lymphs Abs: 2.1 10*3/uL (ref 0.7–4.0)
MCH: 28.4 pg (ref 26.0–34.0)
MCHC: 32.5 g/dL (ref 30.0–36.0)
MCV: 87.3 fL (ref 80.0–100.0)
Monocytes Absolute: 0.6 10*3/uL (ref 0.1–1.0)
Monocytes Relative: 8 %
Neutro Abs: 4.3 10*3/uL (ref 1.7–7.7)
Neutrophils Relative %: 61 %
Platelet Count: 323 10*3/uL (ref 150–400)
RBC: 4.82 MIL/uL (ref 3.87–5.11)
RDW: 13.5 % (ref 11.5–15.5)
WBC Count: 7.1 10*3/uL (ref 4.0–10.5)
nRBC: 0 % (ref 0.0–0.2)

## 2023-01-01 LAB — SAVE SMEAR(SSMR), FOR PROVIDER SLIDE REVIEW

## 2023-01-01 NOTE — Progress Notes (Signed)
[facility] New Patient Consult   Requesting MD: Lujean Amel, [address] [place],  [place] [address]   Donna Armstrong 57 y.o.  01-31-1966    Reason for Consult: Elevated vitamin B12 level, elevated CRP   HPI: Donna Armstrong reports being followed in a wellness clinic and by integrative medicine.  She reports "PMS "symptoms improved several years ago when she was placed on progesterone and thyroid hormone.  She is no longer taking these. She feels well at present.  On 11/01/2022 she had multiple labs checked via Dr. Junius Roads.  The vitamin B12 level was elevated at 1531 [phone]), the CRP was elevated at 20.88.  A CBC found a hemoglobin of 14.4, MCV 86, platelets 358,000, white count 8.4, ANC 5.0, and absolute lymphocyte count 2.5.  A chemistry panel found the creatinine is 0.76, calcium 9.9, total protein 7.5, albumin 3.0, and alkaline phosphatase at 263.  She reports chronic elevation of the alkaline phosphatase (304 on 06/15/2021).  The GGT returned at 24.  She was referred to Dr. Deno Etienne for evaluation of the elevated alkaline phosphatase.  A CT enterography abdomen/pelvis 07/13/2021 revealed hepatic steatosis, no acute GI process, a 5 mm low-attenuation focus in the uncinate of the pancreas was felt to be a small cystic lesion. An MRI/MRCP 08/07/2021 revealed no hepatic steatosis and no suspicious hepatic lesion, a T2 hyperintense well-circumscribed 4 mm area in the pancreas uncinate 8 corresponded with the hypodense lesion on CT.  No suspicious soft tissue enhancement and no ductal dilation.  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  A 2-year follow-up MRI was recommended.    Past Medical History:  Diagnosis Date   Allergic rhinitis    DR. SHAMA   Anal fissure    Anemia    Asthma    Bilateral leg edema    Chest pain    a. 02/2017: stress test with ST depression but images showed only a small region of  distal inferior ischemia vs. artifact. Overall, the study was low-risk.   Hemorrhoids    Joint pain    Menorrhagia    Rectal bleeding    Thyroid disease    Varicose veins     .  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  Social History:   She lives with her husband in [place].  She is a Scientist, research (physical sciences).  She does not use cigarettes or alcohol.  No transfusion history.  No risk factor for HIV or hepatitis.  ROS:   Positives include: Intermittent night sweats for the past few years, 15-20-minute episode of decreased focus in the left eye 2 weeks ago  A complete ROS was otherwise negative.  Physical Exam:  Blood pressure 119/77, pulse 70, temperature 98.1 F (36.7 C), temperature source Oral, resp. rate 18, height 5\' 4"  (1.626 m), weight 192 lb (87.1 kg), SpO2 99 %.  HEENT: Oropharynx without visible  mass, neck without mass Lungs: Clear bilaterally, no respiratory distress Cardiac: Regular rate and rhythm Abdomen: No hepatosplenomegaly  Vascular: No leg edema Lymph nodes: No cervical, supraclavicular, axillary, or inguinal nodes Neurologic: Alert and oriented, the motor exam appears intact in the upper and lower extremities bilaterally Skin: No rash, few small resolving ecchymoses over the extremities Musculoskeletal: No spine tenderness, joints without erythema or edema   LAB:  CBC  Lab Results  Component Value Date   WBC 7.1 01/01/2023   HGB 13.7 01/01/2023   HCT 42.1 01/01/2023   MCV 87.3 01/01/2023   PLT 323 01/01/2023   NEUTROABS 4.3  01/01/2023    Blood smear: The platelets are normal in number.  Red cell morphology is unremarkable.  The majority the white cells are mature appearing neutrophils and lymphocytes.  No blasts or other Thibault forms are seen.  Few 4 lobe and 1-2 5 low neutrophils.    CMP  Lab Results  Component Value Date   NA 138 03/20/2018   K 3.7 03/20/2018   CL 104 03/20/2018   CO2 26 03/20/2018   GLUCOSE 107 (H) 03/20/2018   BUN 17 03/20/2018   CREATININE 0.95 03/20/2018   CALCIUM 8.8 (L) 03/20/2018   PROT 7.5 03/20/2018   ALBUMIN 3.8 03/20/2018   AST 17 03/20/2018   ALT 15 03/20/2018   ALKPHOS 171 (H) 03/20/2018   BILITOT 0.4 03/20/2018   GFRNONAA >60 03/20/2018   GFRAA >60 03/20/2018     Assessment/Plan:   Elevated vitamin B12 level 11/01/2022 Normal CBC 05/17/2022, 11/01/2022, 01/01/2023 Chronic elevation of the CRP Chronic mild elevation of the alkaline phosphatase History of a mildly elevated copper level, normal 11/01/2022, normal ceruloplasmin 11/01/2022 Asthma "Prediabetes " Pancreas uncinate 4 mm well-circumscribed T2 hyperintense lesion on MRI 08/07/2021-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  There is no clinical or laboratory evidence of a myeloproliferative disorder.  I have a low clinical suspicion for a primary hematologic diagnosis.  The chronic mild elevation of the alkaline phosphatase is nonspecific and likely a benign finding.  The chronic CRP elevation may indicate a systemic inflammatory condition, though none has been found to date.  It is possible the vitamin B12 and other lab abnormalities are related to polypharmacy including multiple supplements.  I recommend she follow-up with Dr. Deno Etienne or Dr. Dorthy Cooler for repeat imaging of the pancreas lesion.  I am available to see Donna Armstrong if she develops a new hematologic  abnormality and otherwise as needed.  Betsy Coder, MD  01/01/2023, 2:56 PM

## 2023-01-08 ENCOUNTER — Ambulatory Visit (INDEPENDENT_AMBULATORY_CARE_PROVIDER_SITE_OTHER): Payer: 59 | Admitting: *Deleted

## 2023-01-08 DIAGNOSIS — J309 Allergic rhinitis, unspecified: Secondary | ICD-10-CM

## 2023-01-18 ENCOUNTER — Ambulatory Visit (INDEPENDENT_AMBULATORY_CARE_PROVIDER_SITE_OTHER): Payer: 59

## 2023-01-18 DIAGNOSIS — J309 Allergic rhinitis, unspecified: Secondary | ICD-10-CM | POA: Diagnosis not present

## 2023-01-21 NOTE — Progress Notes (Unsigned)
Follow Up Note  RE: Donna Armstrong MRN: [id number] DOB: Aug 10, 1966 Date of Office Visit: 01/22/2023  Referring provider: Darrow Bussing, MD Primary care provider: Darrow Bussing, MD  Chief Complaint: No chief complaint on file.  History of Present Illness: I had the pleasure of seeing Donna Armstrong for a follow up visit at the [facility] on 01/21/2023. She is a 57 y.o. female, who is being followed for reactive airway disease, allergic rhinitis on AIT. Her previous allergy office visit was on 07/21/2022 with Dr. Selena Batten. Today is a regular follow up visit.  Mild intermittent reactive airway disease Past history - Exercise and URIs tend to trigger her asthma. Uses albuterol as needed with good benefit. In January 2021 had to use nebulizer due to her symptoms. Advair Diskus caused coughing in the past. 2021 spirometry was unremarkable.  Interim history - no issues as she had no URIs since the last visit.  Daily controller medication(s): none. During upper respiratory infections/flares:  Start Dulera 2 puffs twice a day with spacer and rinse mouth afterwards for 1-2 weeks until your breathing symptoms return to baseline.  Pretreat with albuterol 2 puffs or albuterol nebulizer.  If you need to use your albuterol nebulizer machine back to back within 15-30 minutes with no relief then please go to the ER/urgent care for further evaluation.  May use albuterol rescue inhaler 2 puffs or nebulizer every 4 to 6 hours as needed for shortness of breath, chest tightness, coughing, and wheezing. May use albuterol rescue inhaler 2 puffs 5 to 15 minutes prior to strenuous physical activities. Monitor frequency of use.  Get spirometry at next visit. Flu shot given today - VIS given.   Other allergic rhinitis Past history - Perennial rhinitis symptoms for 10+ years. Was on allergy immunotherapy in the past with good benefit and interested in re-starting. Last skin testing was over 3  years ago. No recent ENT evaluation. 2021 skin testing showed: Positive to dust mites, cat and mold. 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 Interim history -  Localized reactions better with allegra pretreatment.  Continue environmental control measures. May use Flonase (fluticasone) nasal spray 1 spray per nostril twice a day as needed for nasal congestion.  Nasal saline spray (i.e., Simply Saline) or nasal saline lavage (i.e., NeilMed) is recommended as needed and prior to medicated nasal sprays. Use over the counter antihistamines such as Zyrtec (cetirizine), Claritin (loratadine), Allegra (fexofenadine), or Xyzal (levocetirizine) daily as needed. May take twice a day during allergy flares. May switch antihistamines every few months. Continue allergy injections - come next week.  Take allegra on the days of your injections.    Return in about 6 months (around 01/20/2023).  Assessment and Plan: Donna Armstrong is a 57 y.o. female with: No problem-specific Assessment & Plan notes found for this encounter.  No follow-ups on file.  No orders of the defined types were placed in this encounter.  Lab Orders  No laboratory test(s) ordered today    Diagnostics: Spirometry:  Tracings reviewed. Her effort: {Blank single:19197::"Good reproducible efforts.","It was hard to get consistent efforts and there is a question as to whether this reflects a maximal maneuver.","Poor effort, data can not be interpreted."} FVC: ***L FEV1: ***L, ***% predicted FEV1/FVC ratio: ***% Interpretation: {Blank single:19197::"Spirometry consistent with mild obstructive disease","Spirometry consistent with moderate obstructive disease","Spirometry consistent with severe obstructive disease","Spirometry consistent with possible restrictive disease","Spirometry consistent with mixed obstructive and restrictive disease","Spirometry uninterpretable due to technique","Spirometry consistent with normal pattern","No overt  abnormalities  noted given today's efforts"}.  Please see scanned spirometry results for details.  Skin Testing: {Blank single:19197::"Select foods","Environmental allergy panel","Environmental allergy panel and select foods","Food allergy panel","None","Deferred due to recent antihistamines use"}. *** Results discussed with patient/family.   Medication List:  Current Outpatient Medications  Medication Sig Dispense Refill  . albuterol (PROVENTIL) (2.5 MG/3ML) 0.083% nebulizer solution SMARTSIG:3 Milliliter(s) Via Nebulizer Every 6 Hours PRN (Patient not taking: Reported on 01/01/2023)    . albuterol (VENTOLIN HFA) 108 (90 Base) MCG/ACT inhaler Inhale 2 puffs into the lungs every 4 (four) hours as needed for wheezing or shortness of breath (coughing fits). (Patient not taking: Reported on 01/01/2023) 18 g 2  . Cholecalciferol (VITAMIN D-3 PO) Take 77 mcg by mouth daily at 6 (six) AM.    . COLLAGEN PO Take 22 g by mouth daily.    . fluticasone (FLONASE) 50 MCG/ACT nasal spray Place 1 spray into both nostrils daily as needed for allergies.  (Patient not taking: Reported on 07/21/2022)    . MAGNESIUM OXIDE PO Take 113 mg by mouth daily.    . Multiple Vitamins-Minerals (ZINC PO) Take 34 mg by mouth daily.    . NON FORMULARY Take 650 mg by mouth daily at 6 (six) AM. Muscadine grape extract    . Omega-3 Fatty Acids (OMEGA 3 PO) Take 600 mg by mouth daily.    Ethelda Chick. Oyster Shell Calcium 500 MG TABS 1 tablet with meals Orally Twice a day for 30 day(s)    . Oyster Shell Calcium 500 MG TABS Take 500 mg by mouth daily.    . Probiotic Product (PROBIOTIC DAILY) CAPS Take 1 capsule by mouth daily.    Marland Kitchen. VITAMIN A PO Take 2,600 mcg by mouth daily.    Marland Kitchen. VITAMIN E PO Take 500 mcg by mouth daily.     No current facility-administered medications for this visit.   Allergies: Allergies  Allergen Reactions  . Ketek [Telithromycin] Other (See Comments)    Dizziness   . Tape   . Erythromycin Rash   I reviewed her past medical  history, social history, family history, and environmental history and no significant changes have been reported from her previous visit.  Review of Systems  Constitutional:  Negative for appetite change, chills, fever and unexpected weight change.  HENT:  Negative for congestion and rhinorrhea.   Eyes:  Negative for itching.  Respiratory:  Negative for cough, chest tightness, shortness of breath and wheezing.   Cardiovascular:  Negative for chest pain.  Gastrointestinal:  Negative for abdominal pain.  Genitourinary:  Negative for difficulty urinating.  Skin:  Negative for rash.  Allergic/Immunologic: Positive for environmental allergies.  Neurological:  Negative for headaches.   Objective: There were no vitals taken for this visit. There is no height or weight on file to calculate BMI. Physical Exam Vitals and nursing note reviewed.  Constitutional:      Appearance: Normal appearance. She is well-developed.  HENT:     Head: Normocephalic and atraumatic.     Right Ear: External ear normal.     Left Ear: External ear normal.     Nose: Nose normal.     Mouth/Throat:     Mouth: Mucous membranes are moist.     Pharynx: Oropharynx is clear.  Eyes:     Conjunctiva/sclera: Conjunctivae normal.  Cardiovascular:     Rate and Rhythm: Normal rate and regular rhythm.     Heart sounds: Normal heart sounds. No murmur heard.    No  friction rub. No gallop.  Pulmonary:     Effort: Pulmonary effort is normal.     Breath sounds: Normal breath sounds. No wheezing, rhonchi or rales.  Musculoskeletal:     Cervical back: Neck supple.  Skin:    General: Skin is warm.     Findings: No rash.  Neurological:     Mental Status: She is alert and oriented to person, place, and time.  Psychiatric:        Behavior: Behavior normal.  Previous notes and tests were reviewed. The plan was reviewed with the patient/family, and all questions/concerned were addressed.  It was my pleasure to see Donna Armstrong  today and participate in her care. Please feel free to contact me with any questions or concerns.  Sincerely,  Wyline Mood, DO Allergy & Immunology  [facility] office: [phone] [facility] office: [phone]

## 2023-01-22 ENCOUNTER — Ambulatory Visit (INDEPENDENT_AMBULATORY_CARE_PROVIDER_SITE_OTHER): Payer: 59 | Admitting: Allergy

## 2023-01-22 ENCOUNTER — Other Ambulatory Visit: Payer: Self-pay

## 2023-01-22 ENCOUNTER — Encounter: Payer: Self-pay | Admitting: Allergy

## 2023-01-22 ENCOUNTER — Ambulatory Visit: Payer: Self-pay | Admitting: *Deleted

## 2023-01-22 VITALS — BP 118/80 | HR 74 | Temp 98.7°F | Resp 18 | Ht 63.39 in | Wt 192.2 lb

## 2023-01-22 DIAGNOSIS — J309 Allergic rhinitis, unspecified: Secondary | ICD-10-CM

## 2023-01-22 DIAGNOSIS — J3089 Other allergic rhinitis: Secondary | ICD-10-CM

## 2023-01-22 DIAGNOSIS — J4520 Mild intermittent asthma, uncomplicated: Secondary | ICD-10-CM | POA: Diagnosis not present

## 2023-01-22 MED ORDER — ALBUTEROL SULFATE HFA 108 (90 BASE) MCG/ACT IN AERS: 2.0000 | INHALATION_SPRAY | RESPIRATORY_TRACT | 2 refills | Status: DC | PRN

## 2023-01-22 MED ORDER — EPINEPHRINE 0.3 MG/0.3ML IJ SOAJ: 0.3000 mg | INTRAMUSCULAR | 1 refills | Status: AC | PRN

## 2023-01-22 NOTE — Assessment & Plan Note (Signed)
Past history - Exercise and URIs tend to trigger her asthma. Uses albuterol as needed with good benefit. In January 2021 had to use nebulizer due to her symptoms. Advair Diskus caused coughing in the past. 2021 spirometry was unremarkable.  Interim history - no issues as she had no URIs since the last visit, no inhaler use.  Daily controller medication(s): none. During respiratory infections/flares:  Start Dulera 2 puffs twice a day with spacer and rinse mouth afterwards for 1-2 weeks until your breathing symptoms return to baseline.  Pretreat with albuterol 2 puffs or albuterol nebulizer.  If you need to use your albuterol nebulizer machine back to back within 15-30 minutes with no relief then please go to the ER/urgent care for further evaluation.  May use albuterol rescue inhaler 2 puffs or nebulizer every 4 to 6 hours as needed for shortness of breath, chest tightness, coughing, and wheezing. May use albuterol rescue inhaler 2 puffs 5 to 15 minutes prior to strenuous physical activities. Monitor frequency of use.  Get spirometry at next visit.

## 2023-01-22 NOTE — Addendum Note (Signed)
Addended by: Orson Aloe on: 01/22/2023 01:26 PM   Modules accepted: Orders

## 2023-01-22 NOTE — Assessment & Plan Note (Signed)
Past history - Perennial rhinitis symptoms for 10+ years. Was on allergy immunotherapy in the past with good benefit and interested in re-starting. Last skin testing was over 3 years ago. No recent ENT evaluation. 2021 skin testing showed: Positive to dust mites, cat and mold. 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 Interim history -  minimal localized reactions. Fostering 1 cat and has her own cat which seems to flare her symptoms.  Continue environmental control measures. May use Flonase (fluticasone) nasal spray 1 spray per nostril twice a day as needed for nasal congestion.  Nasal saline spray (i.e., Simply Saline) or nasal saline lavage (i.e., NeilMed) is recommended as needed and prior to medicated nasal sprays. Use over the counter antihistamines such as Zyrtec (cetirizine), Claritin (loratadine), Allegra (fexofenadine), or Xyzal (levocetirizine) daily as needed. May take twice a day during allergy flares. May switch antihistamines every few months. Continue allergy injections. Take allegra on the days of your injections.

## 2023-01-22 NOTE — Patient Instructions (Addendum)
Environmental allergies Past skin testing showed: Positive to dust mites, cat and mold. Continue environmental control measures. May use Flonase (fluticasone) nasal spray 1 spray per nostril twice a day as needed for nasal congestion.  Nasal saline spray (i.e., Simply Saline) or nasal saline lavage (i.e., NeilMed) is recommended as needed and prior to medicated nasal sprays. Use over the counter antihistamines such as Zyrtec (cetirizine), Claritin (loratadine), Allegra (fexofenadine), or Xyzal (levocetirizine) daily as needed. May take twice a day during allergy flares. May switch antihistamines every few months.  Continue allergy injections. Take allegra on the days of your injections.   Breathing:  Daily controller medication(s): none. During respiratory infections/flares:  Start Dulera 2 puffs twice a day with spacer and rinse mouth afterwards for 1-2 weeks until your breathing symptoms return to baseline.  Pretreat with albuterol 2 puffs or albuterol nebulizer.  If you need to use your albuterol nebulizer machine back to back within 15-30 minutes with no relief then please go to the ER/urgent care for further evaluation.  May use albuterol rescue inhaler 2 puffs or nebulizer every 4 to 6 hours as needed for shortness of breath, chest tightness, coughing, and wheezing. May use albuterol rescue inhaler 2 puffs 5 to 15 minutes prior to strenuous physical activities. Monitor frequency of use.  Asthma control goals:  Full participation in all desired activities (may need albuterol before activity) Albuterol use two times or less a week on average (not counting use with activity) Cough interfering with sleep two times or less a month Oral steroids no more than once a year No hospitalizations   Follow up in 6 months or sooner if needed.

## 2023-02-05 ENCOUNTER — Ambulatory Visit (INDEPENDENT_AMBULATORY_CARE_PROVIDER_SITE_OTHER): Payer: 59

## 2023-02-05 DIAGNOSIS — J309 Allergic rhinitis, unspecified: Secondary | ICD-10-CM | POA: Diagnosis not present

## 2023-02-16 ENCOUNTER — Ambulatory Visit (INDEPENDENT_AMBULATORY_CARE_PROVIDER_SITE_OTHER): Payer: 59

## 2023-02-16 DIAGNOSIS — J309 Allergic rhinitis, unspecified: Secondary | ICD-10-CM

## 2023-02-20 ENCOUNTER — Ambulatory Visit (INDEPENDENT_AMBULATORY_CARE_PROVIDER_SITE_OTHER): Payer: 59

## 2023-02-20 DIAGNOSIS — J309 Allergic rhinitis, unspecified: Secondary | ICD-10-CM

## 2023-03-06 ENCOUNTER — Ambulatory Visit (INDEPENDENT_AMBULATORY_CARE_PROVIDER_SITE_OTHER): Payer: 59

## 2023-03-06 DIAGNOSIS — J309 Allergic rhinitis, unspecified: Secondary | ICD-10-CM

## 2023-03-22 ENCOUNTER — Ambulatory Visit (INDEPENDENT_AMBULATORY_CARE_PROVIDER_SITE_OTHER): Payer: 59

## 2023-03-22 DIAGNOSIS — J309 Allergic rhinitis, unspecified: Secondary | ICD-10-CM | POA: Diagnosis not present

## 2023-04-04 ENCOUNTER — Ambulatory Visit (INDEPENDENT_AMBULATORY_CARE_PROVIDER_SITE_OTHER): Payer: 59

## 2023-04-04 DIAGNOSIS — J309 Allergic rhinitis, unspecified: Secondary | ICD-10-CM

## 2023-04-11 DIAGNOSIS — J3089 Other allergic rhinitis: Secondary | ICD-10-CM | POA: Diagnosis not present

## 2023-04-11 NOTE — Progress Notes (Signed)
VIAL EXP 04-10-24 

## 2023-04-17 ENCOUNTER — Ambulatory Visit: Payer: Self-pay | Admitting: *Deleted

## 2023-04-17 ENCOUNTER — Ambulatory Visit (INDEPENDENT_AMBULATORY_CARE_PROVIDER_SITE_OTHER): Payer: 59

## 2023-04-17 DIAGNOSIS — J309 Allergic rhinitis, unspecified: Secondary | ICD-10-CM

## 2023-04-25 ENCOUNTER — Other Ambulatory Visit: Payer: Self-pay | Admitting: Family Medicine

## 2023-04-25 ENCOUNTER — Ambulatory Visit
Admission: RE | Admit: 2023-04-25 | Discharge: 2023-04-25 | Disposition: A | Discharge status: Home / Self Care | Payer: 59 | Source: Ambulatory Visit | Attending: Family Medicine | Admitting: Family Medicine

## 2023-04-25 ENCOUNTER — Ambulatory Visit (INDEPENDENT_AMBULATORY_CARE_PROVIDER_SITE_OTHER): Payer: 59

## 2023-04-25 DIAGNOSIS — J309 Allergic rhinitis, unspecified: Secondary | ICD-10-CM | POA: Diagnosis not present

## 2023-04-25 DIAGNOSIS — M79675 Pain in left toe(s): Secondary | ICD-10-CM

## 2023-05-04 ENCOUNTER — Ambulatory Visit (INDEPENDENT_AMBULATORY_CARE_PROVIDER_SITE_OTHER): Payer: 59

## 2023-05-04 DIAGNOSIS — J309 Allergic rhinitis, unspecified: Secondary | ICD-10-CM | POA: Diagnosis not present

## 2023-05-08 ENCOUNTER — Other Ambulatory Visit: Payer: Self-pay | Admitting: Family Medicine

## 2023-05-08 DIAGNOSIS — K869 Disease of pancreas, unspecified: Secondary | ICD-10-CM

## 2023-05-16 ENCOUNTER — Ambulatory Visit (INDEPENDENT_AMBULATORY_CARE_PROVIDER_SITE_OTHER): Payer: 59

## 2023-05-16 DIAGNOSIS — J309 Allergic rhinitis, unspecified: Secondary | ICD-10-CM

## 2023-06-01 ENCOUNTER — Ambulatory Visit (INDEPENDENT_AMBULATORY_CARE_PROVIDER_SITE_OTHER): Payer: 59

## 2023-06-01 DIAGNOSIS — J309 Allergic rhinitis, unspecified: Secondary | ICD-10-CM | POA: Diagnosis not present

## 2023-06-07 ENCOUNTER — Ambulatory Visit (INDEPENDENT_AMBULATORY_CARE_PROVIDER_SITE_OTHER): Payer: 59

## 2023-06-07 DIAGNOSIS — J309 Allergic rhinitis, unspecified: Secondary | ICD-10-CM | POA: Diagnosis not present

## 2023-06-26 ENCOUNTER — Ambulatory Visit (INDEPENDENT_AMBULATORY_CARE_PROVIDER_SITE_OTHER): Payer: 59

## 2023-06-26 DIAGNOSIS — J309 Allergic rhinitis, unspecified: Secondary | ICD-10-CM

## 2023-07-06 ENCOUNTER — Ambulatory Visit (INDEPENDENT_AMBULATORY_CARE_PROVIDER_SITE_OTHER): Payer: Self-pay

## 2023-07-06 DIAGNOSIS — J309 Allergic rhinitis, unspecified: Secondary | ICD-10-CM

## 2023-07-09 ENCOUNTER — Ambulatory Visit (INDEPENDENT_AMBULATORY_CARE_PROVIDER_SITE_OTHER): Payer: 59 | Admitting: *Deleted

## 2023-07-09 DIAGNOSIS — J309 Allergic rhinitis, unspecified: Secondary | ICD-10-CM

## 2023-07-20 ENCOUNTER — Encounter: Payer: Self-pay | Admitting: Family Medicine

## 2023-07-23 ENCOUNTER — Encounter: Payer: Self-pay | Admitting: Family Medicine

## 2023-07-24 NOTE — Progress Notes (Unsigned)
Follow Up Note  RE: Donna Armstrong MRN: [id number] DOB: 1966/09/18 Date of Office Visit: 07/25/2023  Referring provider: Darrow Bussing, MD Primary care provider: Darrow Bussing, MD  Chief Complaint: No chief complaint on file.  History of Present Illness: I had the pleasure of seeing Donna Armstrong for a follow up visit at the [facility] on 07/24/2023. She is a 57 y.o. female, who is being followed for allergic rhinitis on AIT and reactive airway disease. Her previous allergy office visit was on 01/22/2023 with Dr. Selena Batten. Today is a regular follow up visit.  Discussed the use of AI scribe software for clinical note transcription with the patient, who gave verbal consent to proceed.  History of Present Illness            Other allergic rhinitis Past history - Perennial rhinitis symptoms for 10+ years. Was on allergy immunotherapy in the past with good benefit and interested in re-starting. Last skin testing was over 3 years ago. No recent ENT evaluation. 2021 skin testing showed: Positive to dust mites, cat and mold. 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 Interim history -  minimal localized reactions. Fostering 1 cat and has her own cat which seems to flare her symptoms.  Continue environmental control measures. May use Flonase (fluticasone) nasal spray 1 spray per nostril twice a day as needed for nasal congestion.  Nasal saline spray (i.e., Simply Saline) or nasal saline lavage (i.e., NeilMed) is recommended as needed and prior to medicated nasal sprays. Use over the counter antihistamines such as Zyrtec (cetirizine), Claritin (loratadine), Allegra (fexofenadine), or Xyzal (levocetirizine) daily as needed. May take twice a day during allergy flares. May switch antihistamines every few months. Continue allergy injections. Take allegra on the days of your injections.    Mild intermittent reactive airway disease Past history - Exercise and URIs tend to trigger her asthma.  Uses albuterol as needed with good benefit. In January 2021 had to use nebulizer due to her symptoms. Advair Diskus caused coughing in the past. 2021 spirometry was unremarkable.  Interim history - no issues as she had no URIs since the last visit, no inhaler use.  Daily controller medication(s): none. During respiratory infections/flares:  Start Dulera 2 puffs twice a day with spacer and rinse mouth afterwards for 1-2 weeks until your breathing symptoms return to baseline.  Pretreat with albuterol 2 puffs or albuterol nebulizer.  If you need to use your albuterol nebulizer machine back to back within 15-30 minutes with no relief then please go to the ER/urgent care for further evaluation.  May use albuterol rescue inhaler 2 puffs or nebulizer every 4 to 6 hours as needed for shortness of breath, chest tightness, coughing, and wheezing. May use albuterol rescue inhaler 2 puffs 5 to 15 minutes prior to strenuous physical activities. Monitor frequency of use.  Get spirometry at next visit.   Return in about 6 months (around 07/24/2023).  Assessment and Plan: Donna Armstrong is a 57 y.o. female with: Allergic rhinitis due to dust mite Allergic rhinitis due to mold Allergic rhinitis due to animal dander Past history - Perennial rhinitis symptoms for 10+ years. No recent ENT evaluation. 2021 skin testing positive to dust mites, cat and mold. 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 Interim history -    Mild intermittent reactive airway disease Past history - triggers are exercise and URIs. Advair Diskus caused coughing in the past. 2021 spirometry was unremarkable.  Interim history -   Assessment and Plan  No follow-ups on file.  No orders of the defined types were placed in this encounter.  Lab Orders  No laboratory test(s) ordered today    Diagnostics: Spirometry:  Tracings reviewed. Her effort: {Blank single:19197::"Good reproducible efforts.","It was hard to get consistent  efforts and there is a question as to whether this reflects a maximal maneuver.","Poor effort, data can not be interpreted."} FVC: ***L FEV1: ***L, ***% predicted FEV1/FVC ratio: ***% Interpretation: {Blank single:19197::"Spirometry consistent with mild obstructive disease","Spirometry consistent with moderate obstructive disease","Spirometry consistent with severe obstructive disease","Spirometry consistent with possible restrictive disease","Spirometry consistent with mixed obstructive and restrictive disease","Spirometry uninterpretable due to technique","Spirometry consistent with normal pattern","No overt abnormalities noted given today's efforts"}.  Please see scanned spirometry results for details.  Skin Testing: {Blank single:19197::"Select foods","Environmental allergy panel","Environmental allergy panel and select foods","Food allergy panel","None","Deferred due to recent antihistamines use"}. *** Results discussed with patient/family.   Medication List:  Current Outpatient Medications  Medication Sig Dispense Refill  . albuterol (PROVENTIL) (2.5 MG/3ML) 0.083% nebulizer solution     . albuterol (VENTOLIN HFA) 108 (90 Base) MCG/ACT inhaler Inhale 2 puffs into the lungs every 4 (four) hours as needed for wheezing or shortness of breath (coughing fits). 18 g 2  . Cholecalciferol (VITAMIN D-3 PO) Take 77 mcg by mouth daily at 6 (six) AM.    . COLLAGEN PO Take 22 g by mouth daily.    Marland Kitchen COVID-19 At Home Test (COVID-19 OTC ANTIGEN 1-PACK VI) Thorne Zinc Picolinate 15 mg oral 1 capsule 2 times a day away from meals    . COVID-19 At Home Test (COVID-19 OTC ANTIGEN 1-PACK VI) Health as it Ought to Be Muscadine Grape 650 mg oral 1 capsule a day    . COVID-19 At Home Test (COVID-19 OTC ANTIGEN 1-PACK VI) Forefront Health Vit E Complex topical 1 pump twice a day    . COVID-19 At Home Test (COVID-19 OTC ANTIGEN 1-PACK VI) Pendulum Metabolic Daily Clostridium Bifidobacterium Anaerobutyricum  Akkermansia oral 1 capsule daily with food    . COVID-19 At Home Test (COVID-19 OTC ANTIGEN 1-PACK VI) Live Conscious Magwell Magnesium Zinc Cholecalciferol 1 cap a day    . EPINEPHrine 0.3 mg/0.3 mL IJ SOAJ injection Inject 0.3 mg into the muscle as needed for anaphylaxis. 2 each 1  . fluticasone (FLONASE) 50 MCG/ACT nasal spray Place 1 spray into both nostrils daily as needed for allergies.  (Patient not taking: Reported on 01/22/2023)    . MAGNESIUM OXIDE PO Take 113 mg by mouth daily.    . Multiple Vitamins-Minerals (ZINC PO) Take 34 mg by mouth daily.    . NON FORMULARY Take 650 mg by mouth daily at 6 (six) AM. Muscadine grape extract    . Omega-3 Fatty Acids (OMEGA 3 PO) Take 600 mg by mouth daily.    Ethelda Chick Calcium 500 MG TABS 1 tablet with meals Orally Twice a day for 30 day(s)    . Oyster Shell Calcium 500 MG TABS Take 500 mg by mouth daily.    . Probiotic Product (PROBIOTIC DAILY) CAPS Take 1 capsule by mouth daily.    Marland Kitchen VITAMIN A PO Take 2,600 mcg by mouth daily.    Marland Kitchen VITAMIN E PO Take 500 mcg by mouth daily.     No current facility-administered medications for this visit.   Allergies: Allergies  Allergen Reactions  . Ketek [Telithromycin] Other (See Comments)    Dizziness   . Tape   . Erythromycin Rash   I reviewed her  past medical history, social history, family history, and environmental history and no significant changes have been reported from her previous visit.  Review of Systems  Constitutional:  Negative for appetite change, chills, fever and unexpected weight change.  HENT:  Positive for postnasal drip. Negative for congestion and rhinorrhea.   Eyes:  Negative for itching.  Respiratory:  Negative for cough, chest tightness, shortness of breath and wheezing.   Cardiovascular:  Negative for chest pain.  Gastrointestinal:  Negative for abdominal pain.  Genitourinary:  Negative for difficulty urinating.  Skin:  Negative for rash.  Allergic/Immunologic:  Positive for environmental allergies.  Neurological:  Negative for headaches.   Objective: There were no vitals taken for this visit. There is no height or weight on file to calculate BMI. Physical Exam Vitals and nursing note reviewed.  Constitutional:      Appearance: Normal appearance. She is well-developed.  HENT:     Head: Normocephalic and atraumatic.     Right Ear: Tympanic membrane and external ear normal.     Left Ear: Tympanic membrane and external ear normal.     Nose: Nose normal.     Mouth/Throat:     Mouth: Mucous membranes are moist.     Pharynx: Oropharynx is clear.     Comments: Cobblestoning noted Eyes:     Conjunctiva/sclera: Conjunctivae normal.  Cardiovascular:     Rate and Rhythm: Normal rate and regular rhythm.     Heart sounds: Normal heart sounds. No murmur heard.    No friction rub. No gallop.  Pulmonary:     Effort: Pulmonary effort is normal.     Breath sounds: Normal breath sounds. No wheezing, rhonchi or rales.  Musculoskeletal:     Cervical back: Neck supple.  Skin:    General: Skin is warm.     Findings: No rash.  Neurological:     Mental Status: She is alert and oriented to person, place, and time.  Psychiatric:        Behavior: Behavior normal.  Previous notes and tests were reviewed. The plan was reviewed with the patient/family, and all questions/concerned were addressed.  It was my pleasure to see Aseel today and participate in her care. Please feel free to contact me with any questions or concerns.  Sincerely,  Wyline Mood, DO Allergy & Immunology  [facility] office: [phone] [facility] office: [phone]

## 2023-07-25 ENCOUNTER — Ambulatory Visit: Payer: Self-pay | Admitting: *Deleted

## 2023-07-25 ENCOUNTER — Encounter: Payer: Self-pay | Admitting: Allergy

## 2023-07-25 ENCOUNTER — Other Ambulatory Visit: Payer: Self-pay

## 2023-07-25 ENCOUNTER — Telehealth: Payer: Self-pay | Admitting: *Deleted

## 2023-07-25 ENCOUNTER — Ambulatory Visit (INDEPENDENT_AMBULATORY_CARE_PROVIDER_SITE_OTHER): Payer: 59 | Admitting: Allergy

## 2023-07-25 VITALS — BP 128/80 | HR 93 | Temp 97.8°F | Resp 18 | Ht 63.0 in | Wt 184.1 lb

## 2023-07-25 DIAGNOSIS — J3081 Allergic rhinitis due to animal (cat) (dog) hair and dander: Secondary | ICD-10-CM

## 2023-07-25 DIAGNOSIS — J4520 Mild intermittent asthma, uncomplicated: Secondary | ICD-10-CM | POA: Diagnosis not present

## 2023-07-25 DIAGNOSIS — J309 Allergic rhinitis, unspecified: Secondary | ICD-10-CM

## 2023-07-25 DIAGNOSIS — J3089 Other allergic rhinitis: Secondary | ICD-10-CM | POA: Diagnosis not present

## 2023-07-25 MED ORDER — ALBUTEROL SULFATE (2.5 MG/3ML) 0.083% IN NEBU: 2.5000 mg | INHALATION_SOLUTION | RESPIRATORY_TRACT | 2 refills | Status: DC | PRN

## 2023-07-25 MED ORDER — AIRSUPRA 90-80 MCG/ACT IN AERO: 2.0000 | INHALATION_SPRAY | RESPIRATORY_TRACT | 2 refills | Status: AC | PRN

## 2023-07-25 NOTE — Patient Instructions (Addendum)
Environmental allergies Past skin testing showed: Positive to dust mites, cat and mold. Continue environmental control measures. Use Flonase (fluticasone) nasal spray 1-2 sprays per nostril once a day as needed for nasal congestion.  Nasal saline spray (i.e., Simply Saline) or nasal saline lavage (i.e., NeilMed) is recommended as needed and prior to medicated nasal sprays. Use over the counter antihistamines such as Zyrtec (cetirizine), Claritin (loratadine), Allegra (fexofenadine), or Xyzal (levocetirizine) daily as needed. May take twice a day during allergy flares. May switch antihistamines every few months.  Continue allergy injections. Will build up weekly (0.1, 0.2, 0.3, 0.4, 0.5) - then go every 2 weeks with current vial. If no issues then go to every 4 weeks with next red vial.  Breathing:  May use Airsupra rescue inhaler 2 puffs every 4 to 6 hours as needed for shortness of breath, chest tightness, coughing, and wheezing. Do not use more than 12 puffs in 24 hours. May use Airsupra rescue inhaler 2 puffs 5 to 15 minutes prior to strenuous physical activities. Rinse mouth after each use.  Coupon given.  Monitor frequency of use - if you need to use it more than twice per week on a consistent basis let us know.   May use albuterol nebulizer every 4 to 6 hours as needed for shortness of breath, chest tightness, coughing, and wheezing. Monitor frequency of use.  Asthma control goals:  Full participation in all desired activities (may need albuterol before activity) Albuterol use two times or less a week on average (not counting use with activity) Cough interfering with sleep two times or less a month Oral steroids no more than once a year No hospitalizations   Follow up in 12 months or sooner if needed.

## 2023-07-25 NOTE — Telephone Encounter (Signed)
Patient's allergy flow sheet has been updated to reflect these changes.

## 2023-07-25 NOTE — Telephone Encounter (Signed)
-----   Message from Ellamae Sia sent at 07/25/2023  9:14 AM EDT ----- Current red vial - red 0.5 every 2 weeks. Next red vial go to every 4 weeks once on maintenance dose.

## 2023-07-26 ENCOUNTER — Ambulatory Visit
Admission: RE | Admit: 2023-07-26 | Discharge: 2023-07-26 | Disposition: A | Discharge status: Home / Self Care | Payer: 59 | Source: Ambulatory Visit | Attending: Family Medicine | Admitting: Family Medicine

## 2023-07-26 DIAGNOSIS — K869 Disease of pancreas, unspecified: Secondary | ICD-10-CM

## 2023-07-26 MED ORDER — GADOPICLENOL 0.5 MMOL/ML IV SOLN
9.0000 mL | Freq: Once | INTRAVENOUS | Status: AC | PRN
  Administered 2023-07-26: 9 mL via INTRAVENOUS

## 2023-08-14 ENCOUNTER — Ambulatory Visit (INDEPENDENT_AMBULATORY_CARE_PROVIDER_SITE_OTHER): Payer: Self-pay | Admitting: *Deleted

## 2023-08-14 DIAGNOSIS — J309 Allergic rhinitis, unspecified: Secondary | ICD-10-CM

## 2023-08-23 ENCOUNTER — Ambulatory Visit (INDEPENDENT_AMBULATORY_CARE_PROVIDER_SITE_OTHER): Payer: 59

## 2023-08-23 DIAGNOSIS — J309 Allergic rhinitis, unspecified: Secondary | ICD-10-CM | POA: Diagnosis not present

## 2023-08-30 ENCOUNTER — Ambulatory Visit (INDEPENDENT_AMBULATORY_CARE_PROVIDER_SITE_OTHER): Payer: 59

## 2023-08-30 DIAGNOSIS — J309 Allergic rhinitis, unspecified: Secondary | ICD-10-CM

## 2023-09-24 ENCOUNTER — Ambulatory Visit (INDEPENDENT_AMBULATORY_CARE_PROVIDER_SITE_OTHER): Payer: 59

## 2023-09-24 DIAGNOSIS — J309 Allergic rhinitis, unspecified: Secondary | ICD-10-CM

## 2023-10-15 ENCOUNTER — Other Ambulatory Visit: Payer: Self-pay | Admitting: Gastroenterology

## 2023-10-15 DIAGNOSIS — K869 Disease of pancreas, unspecified: Secondary | ICD-10-CM

## 2023-10-23 ENCOUNTER — Ambulatory Visit (INDEPENDENT_AMBULATORY_CARE_PROVIDER_SITE_OTHER): Payer: 59 | Admitting: *Deleted

## 2023-10-23 DIAGNOSIS — J309 Allergic rhinitis, unspecified: Secondary | ICD-10-CM | POA: Diagnosis not present

## 2023-10-24 DIAGNOSIS — J3089 Other allergic rhinitis: Secondary | ICD-10-CM | POA: Diagnosis not present

## 2023-10-25 NOTE — Progress Notes (Signed)
 VIAL EXP 10-24-24

## 2023-11-20 ENCOUNTER — Ambulatory Visit (INDEPENDENT_AMBULATORY_CARE_PROVIDER_SITE_OTHER): Payer: Self-pay

## 2023-11-20 DIAGNOSIS — J309 Allergic rhinitis, unspecified: Secondary | ICD-10-CM

## 2023-12-28 ENCOUNTER — Ambulatory Visit (INDEPENDENT_AMBULATORY_CARE_PROVIDER_SITE_OTHER): Payer: Self-pay | Admitting: *Deleted

## 2023-12-28 DIAGNOSIS — J309 Allergic rhinitis, unspecified: Secondary | ICD-10-CM | POA: Diagnosis not present

## 2024-01-30 ENCOUNTER — Ambulatory Visit (INDEPENDENT_AMBULATORY_CARE_PROVIDER_SITE_OTHER): Payer: Self-pay

## 2024-01-30 DIAGNOSIS — J309 Allergic rhinitis, unspecified: Secondary | ICD-10-CM | POA: Diagnosis not present

## 2024-02-05 ENCOUNTER — Ambulatory Visit (INDEPENDENT_AMBULATORY_CARE_PROVIDER_SITE_OTHER)

## 2024-02-05 DIAGNOSIS — J309 Allergic rhinitis, unspecified: Secondary | ICD-10-CM

## 2024-02-19 ENCOUNTER — Ambulatory Visit (INDEPENDENT_AMBULATORY_CARE_PROVIDER_SITE_OTHER): Payer: Self-pay

## 2024-02-19 DIAGNOSIS — J309 Allergic rhinitis, unspecified: Secondary | ICD-10-CM | POA: Diagnosis not present

## 2024-03-07 ENCOUNTER — Ambulatory Visit (INDEPENDENT_AMBULATORY_CARE_PROVIDER_SITE_OTHER)

## 2024-03-07 DIAGNOSIS — J309 Allergic rhinitis, unspecified: Secondary | ICD-10-CM

## 2024-03-17 ENCOUNTER — Ambulatory Visit (INDEPENDENT_AMBULATORY_CARE_PROVIDER_SITE_OTHER)

## 2024-03-17 DIAGNOSIS — J309 Allergic rhinitis, unspecified: Secondary | ICD-10-CM

## 2024-04-17 ENCOUNTER — Ambulatory Visit (INDEPENDENT_AMBULATORY_CARE_PROVIDER_SITE_OTHER)

## 2024-04-17 DIAGNOSIS — J309 Allergic rhinitis, unspecified: Secondary | ICD-10-CM

## 2024-04-25 ENCOUNTER — Ambulatory Visit (INDEPENDENT_AMBULATORY_CARE_PROVIDER_SITE_OTHER)

## 2024-04-25 DIAGNOSIS — J309 Allergic rhinitis, unspecified: Secondary | ICD-10-CM

## 2024-05-23 ENCOUNTER — Ambulatory Visit (INDEPENDENT_AMBULATORY_CARE_PROVIDER_SITE_OTHER)

## 2024-05-23 DIAGNOSIS — J309 Allergic rhinitis, unspecified: Secondary | ICD-10-CM

## 2024-06-03 ENCOUNTER — Ambulatory Visit (INDEPENDENT_AMBULATORY_CARE_PROVIDER_SITE_OTHER)

## 2024-06-03 DIAGNOSIS — J309 Allergic rhinitis, unspecified: Secondary | ICD-10-CM | POA: Diagnosis not present

## 2024-07-18 ENCOUNTER — Ambulatory Visit (INDEPENDENT_AMBULATORY_CARE_PROVIDER_SITE_OTHER)

## 2024-07-18 DIAGNOSIS — J309 Allergic rhinitis, unspecified: Secondary | ICD-10-CM | POA: Diagnosis not present

## 2024-07-27 NOTE — Progress Notes (Unsigned)
 Follow Up Note  RE: Donna Armstrong MRN: [id number] DOB: July 14, 1966 Date of Office Visit: 07/28/2024  Referring provider: Regino Slater, MD Primary care provider: Regino Slater, MD  Chief Complaint: No chief complaint on file.  History of Present Illness: I had the pleasure of seeing Donna Armstrong for a follow up visit at the [facility] on 07/28/2024. She is a 59 y.o. female, who is being followed for allergic rhinitis on AIT and reactive airway disease. Her previous allergy  office visit was on 07/25/2023 with Dr. Luke. Today is a regular follow up visit.  Discussed the use of AI scribe software for clinical note transcription with the patient, who gave verbal consent to proceed.  History of Present Illness            ***  Assessment and Plan: Donna Armstrong is a 58 y.o. female with: Allergic rhinitis due to dust mite Allergic rhinitis due to mold Allergic rhinitis due to animal dander Past history - Perennial rhinitis symptoms for 10+ years. No recent ENT evaluation. 2021 skin testing positive to dust mites, cat and mold. 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 Interim history -  Stable with allergy  shots. No current use of allergy  medications or nasal sprays.  Continue environmental control measures. Use Flonase (fluticasone) nasal spray 1-2 sprays per nostril once a day as needed for nasal congestion.  Nasal saline spray (i.e., Simply Saline) or nasal saline lavage (i.e., NeilMed) is recommended as needed and prior to medicated nasal sprays. Use over the counter antihistamines such as Zyrtec (cetirizine), Claritin (loratadine), Allegra (fexofenadine), or Xyzal (levocetirizine) daily as needed. May take twice a day during allergy  flares. May switch antihistamines every few months. Continue allergy  injections - given today. Will build up weekly (0.1, 0.2, 0.3, 0.4, 0.5) - then go every 2 weeks with current vial. If no issues then go to every 4 weeks with next red vial.    Mild intermittent reactive airway disease Past history - triggers are exercise and URIs. Advair Diskus caused coughing in the past. 2021 spirometry was unremarkable.  Interim history - no flares. Today's spirometry was normal. May use Airsupra  rescue inhaler 2 puffs every 4 to 6 hours as needed for shortness of breath, chest tightness, coughing, and wheezing. Do not use more than 12 puffs in 24 hours. May use Airsupra  rescue inhaler 2 puffs 5 to 15 minutes prior to strenuous physical activities. Rinse mouth after each use.  Coupon given.  Monitor frequency of use - if you need to use it more than twice per week on a consistent basis let us  know.  May use albuterol  nebulizer every 4 to 6 hours as needed for shortness of breath, chest tightness, coughing, and wheezing. Monitor frequency of use.  Assessment and Plan              No follow-ups on file.  No orders of the defined types were placed in this encounter.  Lab Orders  No laboratory test(s) ordered today    Diagnostics: Spirometry:  Tracings reviewed. Her effort: {Blank single:19197::Good reproducible efforts.,It was hard to get consistent efforts and there is a question as to whether this reflects a maximal maneuver.,Poor effort, data can not be interpreted.} FVC: ***L FEV1: ***L, ***% predicted FEV1/FVC ratio: ***% Interpretation: {Blank single:19197::Spirometry consistent with mild obstructive disease,Spirometry consistent with moderate obstructive disease,Spirometry consistent with severe obstructive disease,Spirometry consistent with possible restrictive disease,Spirometry consistent with mixed obstructive and restrictive disease,Spirometry uninterpretable due to technique,Spirometry consistent with normal pattern,No  overt abnormalities noted given today's efforts}.  Please see scanned spirometry results for details.  Skin Testing: {Blank single:19197::Select foods,Environmental allergy   panel,Environmental allergy  panel and select foods,Food allergy  panel,None,Deferred due to recent antihistamines use}. *** Results discussed with patient/family.   Medication List:  Current Outpatient Medications  Medication Sig Dispense Refill   albuterol  (PROVENTIL ) (2.5 MG/3ML) 0.083% nebulizer solution Take 3 mLs (2.5 mg total) by nebulization every 4 (four) hours as needed for wheezing or shortness of breath. 75 mL 2   albuterol  (VENTOLIN  HFA) 108 (90 Base) MCG/ACT inhaler Inhale 2 puffs into the lungs every 4 (four) hours as needed for wheezing or shortness of breath (coughing fits). 18 g 2   Albuterol -Budesonide (AIRSUPRA ) 90-80 MCG/ACT AERO Inhale 2 puffs into the lungs every 4 (four) hours as needed (coughing, wheezing, chest tightness). Do not exceed 12 puffs in 24 hours. 10.7 g 2   Cholecalciferol (VITAMIN D-3 PO) Take 77 mcg by mouth daily at 6 (six) AM.     COLLAGEN PO Take 22 g by mouth daily.     COVID-19 At Home Test (COVID-19 OTC ANTIGEN 1-PACK VI) Thorne Zinc Picolinate 15 mg oral 1 capsule 2 times a day away from meals     COVID-19 At Home Test (COVID-19 OTC ANTIGEN 1-PACK VI) Health as it Ought to Be Muscadine Grape 650 mg oral 1 capsule a day     COVID-19 At Home Test (COVID-19 OTC ANTIGEN 1-PACK VI) Forefront Health Vit E Complex topical 1 pump twice a day     COVID-19 At Home Test (COVID-19 OTC ANTIGEN 1-PACK VI) Pendulum Metabolic Daily Clostridium Bifidobacterium Anaerobutyricum Akkermansia oral 1 capsule daily with food     COVID-19 At Home Test (COVID-19 OTC ANTIGEN 1-PACK VI) Live Conscious Magwell Magnesium Zinc Cholecalciferol 1 cap a day     EPINEPHrine  0.3 mg/0.3 mL IJ SOAJ injection Inject 0.3 mg into the muscle as needed for anaphylaxis. 2 each 1   fluticasone (FLONASE) 50 MCG/ACT nasal spray Place 1 spray into both nostrils daily as needed for allergies.  (Patient not taking: Reported on 07/25/2023)     MAGNESIUM OXIDE PO Take 113 mg by mouth daily.      Multiple Vitamins-Minerals (ZINC PO) Take 34 mg by mouth daily.     NON FORMULARY Take 650 mg by mouth daily at 6 (six) AM. Muscadine grape extract     Omega-3 Fatty Acids (OMEGA 3 PO) Take 600 mg by mouth daily.     Oyster Shell Calcium 500 MG TABS 1 tablet with meals Orally Twice a day for 30 day(s)     Oyster Shell Calcium 500 MG TABS Take 500 mg by mouth daily.     Probiotic Product (PROBIOTIC DAILY) CAPS Take 1 capsule by mouth daily.     VITAMIN A PO Take 2,600 mcg by mouth daily.     VITAMIN E PO Take 500 mcg by mouth daily.     No current facility-administered medications for this visit.   Allergies: Allergies  Allergen Reactions   Ketek [Telithromycin] Other (See Comments)    Dizziness    Tape    Erythromycin Rash   I reviewed her past medical history, social history, family history, and environmental history and no significant changes have been reported from her previous visit.  Review of Systems  Constitutional:  Negative for appetite change, chills, fever and unexpected weight change.  HENT:  Negative for congestion, postnasal drip and rhinorrhea.   Eyes:  Negative for itching.  Respiratory:  Negative for cough, chest tightness, shortness of breath and wheezing.   Cardiovascular:  Negative for chest pain.  Gastrointestinal:  Negative for abdominal pain.  Genitourinary:  Negative for difficulty urinating.  Skin:  Negative for rash.  Allergic/Immunologic: Positive for environmental allergies.  Neurological:  Negative for headaches.    Objective: There were no vitals taken for this visit. There is no height or weight on file to calculate BMI. Physical Exam Vitals and nursing note reviewed.  Constitutional:      Appearance: Normal appearance. She is well-developed.  HENT:     Head: Normocephalic and atraumatic.     Right Ear: Tympanic membrane and external ear normal.     Left Ear: Tympanic membrane and external ear normal.     Nose: Nose normal.      Mouth/Throat:     Mouth: Mucous membranes are moist.     Pharynx: Oropharynx is clear.     Comments: Cobblestoning noted Eyes:     Conjunctiva/sclera: Conjunctivae normal.  Cardiovascular:     Rate and Rhythm: Normal rate and regular rhythm.     Heart sounds: Normal heart sounds. No murmur heard.    No friction rub. No gallop.  Pulmonary:     Effort: Pulmonary effort is normal.     Breath sounds: Normal breath sounds. No wheezing, rhonchi or rales.  Musculoskeletal:     Cervical back: Neck supple.  Skin:    General: Skin is warm.     Findings: No rash.  Neurological:     Mental Status: She is alert and oriented to person, place, and time.  Psychiatric:        Behavior: Behavior normal.    Previous notes and tests were reviewed. The plan was reviewed with the patient/family, and all questions/concerned were addressed.  It was my pleasure to see Donna Armstrong today and participate in her care. Please feel free to contact me with any questions or concerns.  Sincerely,  Orlan Cramp, DO Allergy  & Immunology  [facility]  [place] office: [phone] [facility] office: [phone]

## 2024-07-28 ENCOUNTER — Encounter: Payer: Self-pay | Admitting: Allergy

## 2024-07-28 ENCOUNTER — Other Ambulatory Visit: Payer: Self-pay

## 2024-07-28 ENCOUNTER — Ambulatory Visit (INDEPENDENT_AMBULATORY_CARE_PROVIDER_SITE_OTHER): Payer: 59 | Admitting: Allergy

## 2024-07-28 VITALS — BP 110/72 | HR 83 | Temp 97.7°F | Ht 64.0 in | Wt 193.7 lb

## 2024-07-28 DIAGNOSIS — J4520 Mild intermittent asthma, uncomplicated: Secondary | ICD-10-CM

## 2024-07-28 DIAGNOSIS — J3089 Other allergic rhinitis: Secondary | ICD-10-CM | POA: Diagnosis not present

## 2024-07-28 DIAGNOSIS — J3081 Allergic rhinitis due to animal (cat) (dog) hair and dander: Secondary | ICD-10-CM | POA: Diagnosis not present

## 2024-07-28 MED ORDER — NEFFY 2 MG/0.1ML NA SOLN
1.0000 | NASAL | 1 refills | Status: AC | PRN
Start: 2024-07-28 — End: ?

## 2024-07-28 MED ORDER — AZELASTINE HCL 0.1 % NA SOLN: 1.0000 | Freq: Two times a day (BID) | NASAL | 5 refills | Status: AC | PRN

## 2024-07-28 NOTE — Patient Instructions (Addendum)
 Environmental allergies Past skin testing positive to dust mites, cat and mold. Continue environmental control measures. Use Flonase (fluticasone) nasal spray 1-2 sprays per nostril once a day as needed for nasal congestion.  Use azelastine nasal spray 1-2 sprays per nostril twice a day as needed for runny nose/drainage. Nasal saline spray (i.e., Simply Saline) or nasal saline lavage (i.e., NeilMed) is recommended as needed and prior to medicated nasal sprays. Use over the counter antihistamines such as Zyrtec (cetirizine), Claritin (loratadine), Allegra (fexofenadine), or Xyzal (levocetirizine) daily as needed. May take twice a day during allergy  flares. May switch antihistamines every few months.  Continue allergy  injections. Will go to every 2 weeks and monitor symptoms. Take an antihistamine the day of your injections.   I have prescribed epinephrine  device (Neffy ) and demonstrated proper use. For mild symptoms you can take over the counter antihistamines such as zyrtec 10mg  to 20mg  and monitor symptoms closely. If symptoms worsen or if you have severe symptoms including breathing issues, throat closure, significant swelling, whole body hives, severe diarrhea and vomiting, lightheadedness then spray Neffy  in the nose and seek immediate medical care afterwards. Do not use any nasal sprays for 2 weeks afterwards.  If Neffy  is not covered let me know.  Emergency action plan provided.   Breathing:  Normal spirometry today.  May use Airsupra  rescue inhaler 2 puffs every 4 to 6 hours as needed for shortness of breath, chest tightness, coughing, and wheezing. Do not use more than 12 puffs in 24 hours. May use Airsupra  rescue inhaler 2 puffs 5 to 15 minutes prior to strenuous physical activities. Rinse mouth after each use.  Monitor frequency of use - if you need to use it more than twice per week on a consistent basis let us  know.  May use albuterol  nebulizer every 4 to 6 hours as needed for  shortness of breath, chest tightness, coughing, and wheezing. Monitor frequency of use.  Asthma control goals:  Full participation in all desired activities (may need albuterol  before activity) Albuterol  use two times or less a week on average (not counting use with activity) Cough interfering with sleep two times or less a month Oral steroids no more than once a year No hospitalizations   Follow up in 12 months or sooner if needed.

## 2024-08-08 ENCOUNTER — Ambulatory Visit

## 2024-08-08 DIAGNOSIS — J309 Allergic rhinitis, unspecified: Secondary | ICD-10-CM | POA: Diagnosis not present

## 2024-08-29 ENCOUNTER — Ambulatory Visit (INDEPENDENT_AMBULATORY_CARE_PROVIDER_SITE_OTHER)

## 2024-08-29 DIAGNOSIS — J3089 Other allergic rhinitis: Secondary | ICD-10-CM

## 2024-08-29 DIAGNOSIS — J309 Allergic rhinitis, unspecified: Secondary | ICD-10-CM

## 2024-09-23 ENCOUNTER — Ambulatory Visit

## 2024-09-23 DIAGNOSIS — J309 Allergic rhinitis, unspecified: Secondary | ICD-10-CM | POA: Diagnosis not present

## 2024-09-25 DIAGNOSIS — J3089 Other allergic rhinitis: Secondary | ICD-10-CM | POA: Diagnosis not present

## 2024-09-25 DIAGNOSIS — J3081 Allergic rhinitis due to animal (cat) (dog) hair and dander: Secondary | ICD-10-CM | POA: Diagnosis not present

## 2024-09-25 DIAGNOSIS — J302 Other seasonal allergic rhinitis: Secondary | ICD-10-CM | POA: Diagnosis not present

## 2024-09-25 NOTE — Progress Notes (Signed)
 VIAL MADE ON 09/25/24

## 2024-10-09 ENCOUNTER — Other Ambulatory Visit: Payer: Self-pay | Admitting: Gastroenterology

## 2024-10-09 DIAGNOSIS — K869 Disease of pancreas, unspecified: Secondary | ICD-10-CM

## 2024-10-13 ENCOUNTER — Ambulatory Visit

## 2024-10-13 DIAGNOSIS — J309 Allergic rhinitis, unspecified: Secondary | ICD-10-CM | POA: Diagnosis not present

## 2024-10-28 ENCOUNTER — Ambulatory Visit (INDEPENDENT_AMBULATORY_CARE_PROVIDER_SITE_OTHER)

## 2024-10-28 DIAGNOSIS — J302 Other seasonal allergic rhinitis: Secondary | ICD-10-CM | POA: Diagnosis not present

## 2024-11-04 ENCOUNTER — Ambulatory Visit

## 2024-11-04 DIAGNOSIS — J302 Other seasonal allergic rhinitis: Secondary | ICD-10-CM | POA: Diagnosis not present

## 2024-11-04 DIAGNOSIS — J3089 Other allergic rhinitis: Secondary | ICD-10-CM

## 2024-11-20 ENCOUNTER — Ambulatory Visit

## 2024-11-20 DIAGNOSIS — J302 Other seasonal allergic rhinitis: Secondary | ICD-10-CM

## 2025-07-27 ENCOUNTER — Ambulatory Visit: Admitting: Allergy
